# Patient Record
Sex: Male | Born: 1945 | Race: Black or African American | Hispanic: No | Marital: Single | Smoking: Never smoker
Health system: Southern US, Community
[De-identification: ages and names within clinical notes are randomized; demographics above are authoritative.]

## PROBLEM LIST (undated history)

## (undated) DIAGNOSIS — T7840XA Allergy, unspecified, initial encounter: Secondary | ICD-10-CM

## (undated) DIAGNOSIS — M1990 Unspecified osteoarthritis, unspecified site: Secondary | ICD-10-CM

## (undated) HISTORY — PX: TONSILLECTOMY: SUR1361

## (undated) HISTORY — DX: Unspecified osteoarthritis, unspecified site: M19.90

## (undated) HISTORY — DX: Allergy, unspecified, initial encounter: T78.40XA

## (undated) HISTORY — PX: APPENDECTOMY: SHX54

---

## 1998-08-14 ENCOUNTER — Encounter: Payer: Self-pay | Admitting: Thoracic Surgery

## 1998-08-14 ENCOUNTER — Ambulatory Visit (HOSPITAL_COMMUNITY): Admission: RE | Admit: 1998-08-14 | Discharge: 1998-08-14 | Payer: Self-pay | Admitting: Thoracic Surgery

## 2005-03-28 ENCOUNTER — Emergency Department (HOSPITAL_COMMUNITY): Admission: EM | Admit: 2005-03-28 | Discharge: 2005-03-29 | Payer: Self-pay | Admitting: Emergency Medicine

## 2007-03-15 ENCOUNTER — Inpatient Hospital Stay (HOSPITAL_COMMUNITY): Admission: EM | Admit: 2007-03-15 | Discharge: 2007-03-30 | Payer: Self-pay | Admitting: Emergency Medicine

## 2007-03-16 ENCOUNTER — Ambulatory Visit: Payer: Self-pay | Admitting: Infectious Diseases

## 2007-03-19 ENCOUNTER — Encounter (INDEPENDENT_AMBULATORY_CARE_PROVIDER_SITE_OTHER): Payer: Self-pay | Admitting: *Deleted

## 2007-03-19 ENCOUNTER — Ambulatory Visit: Payer: Self-pay | Admitting: Vascular Surgery

## 2010-01-13 ENCOUNTER — Encounter (INDEPENDENT_AMBULATORY_CARE_PROVIDER_SITE_OTHER): Payer: Self-pay | Admitting: Emergency Medicine

## 2010-01-13 ENCOUNTER — Emergency Department (HOSPITAL_COMMUNITY)
Admission: EM | Admit: 2010-01-13 | Discharge: 2010-01-13 | Disposition: A | Discharge status: Other Acute Inpatient Hospital | Payer: Self-pay | Source: Home / Self Care | Admitting: Family Medicine

## 2010-01-13 ENCOUNTER — Inpatient Hospital Stay (HOSPITAL_COMMUNITY)
Admission: EM | Admit: 2010-01-13 | Discharge: 2010-01-15 | Payer: Self-pay | Source: Home / Self Care | Attending: Internal Medicine | Admitting: Internal Medicine

## 2010-02-07 NOTE — Discharge Summary (Addendum)
  NAME:  William Hutchinson, William Hutchinson            ACCOUNT NO.:  [id number]  MEDICAL RECORD NO.:  [id number]          PATIENT TYPE:  INP  LOCATION:  5522                         FACILITY:  [facility]  PHYSICIAN:  Rock Nephew, MD       DATE OF BIRTH:  [birth year]  DATE OF ADMISSION:  01/13/2010 DATE OF DISCHARGE:  01/15/2010                        DISCHARGE SUMMARY - REFERRING   PRIMARY CARE PHYSICIAN:  Dr. Theodoro Grist.  Office phone number [phone].  DISCHARGE MEDICATIONS:  The patient's discharge diagnosis left lower extremity cellulitis, improved protein-calorie malnutrition, pre diabetes and obesity.  DISCHARGE MEDICATIONS:  The patient are: Clindamycin 300 mg by mouth  daily.  DIET:  The patient's diet should be  low carbohydrate, low-fat, low- calorie.  FOLLOW-UP:  The patient's follow-up with Dr. Theodoro Grist within 1 week.  PROCEDURE PERFORMED:  The patient had no procedures performed.  CONSULTATIONS:  None.  CHIEF COMPLAINT:  Left lower extremity cellulitis.  HISTORY OF PRESENT ILLNESS:  The patient is a  65 year old male with no past medical history, on no chronic medications presents to emergency room with left leg swelling, increased redness and painful for the last 4-5 days.  He denies any fevers or chills, any systemic illness.  The patient had ultrasound in the emergency room with a left leg which no evidence of DVT.  The patient had a normal cell white with WBC count.  HOSPITAL COURSE: 1. Left lower extremity cellulitis.  The patient was placed on     vancomycin and Zosyn.  During the hospitalization the patient had     blood cultures drawn which were negative 2 out of 2.  The patient     will be discharged home on an additional 8 days of clindamycin.     The patient received 3 days of vancomycin, Zosyn during the     hospitalization.  Patient follow up with Dr. Theodoro Grist within 1     week. 2. Protein-calorie malnutrition. The patient's  albumin came back at     2.5.  This  could be related to the cellulitis     for the patient, however the patient's albumin should be monitored     in the future. 3. Pre diabetes.  The patient had a hemoglobin A1c which came back at     5.9 with mean plasma glucose 123.  The patient was counseled to     lose weight and avoid heavy carbohydrate meals.     Rock Nephew, MD     NH/MEDQ  D:  01/15/2010  T:  01/15/2010  Job:  [id number]  Electronically Signed by Rock Nephew MD on 02/07/2010 06:13:09 PM

## 2010-03-26 LAB — DIFFERENTIAL
Basophils Absolute: 0 10*3/uL (ref 0.0–0.1)
Basophils Absolute: 0 10*3/uL (ref 0.0–0.1)
Basophils Relative: 0 % (ref 0–1)
Basophils Relative: 0 % (ref 0–1)
Eosinophils Absolute: 0.1 10*3/uL (ref 0.0–0.7)
Eosinophils Absolute: 0.4 10*3/uL (ref 0.0–0.7)
Eosinophils Relative: 1 % (ref 0–5)
Eosinophils Relative: 4 % (ref 0–5)
Lymphocytes Relative: 19 % (ref 12–46)
Lymphocytes Relative: 19 % (ref 12–46)
Lymphs Abs: 1.6 10*3/uL (ref 0.7–4.0)
Lymphs Abs: 1.7 10*3/uL (ref 0.7–4.0)
Monocytes Absolute: 0.9 10*3/uL (ref 0.1–1.0)
Monocytes Absolute: 1.1 10*3/uL — ABNORMAL HIGH (ref 0.1–1.0)
Monocytes Relative: 10 % (ref 3–12)
Monocytes Relative: 12 % (ref 3–12)
Neutro Abs: 5.6 10*3/uL (ref 1.7–7.7)
Neutro Abs: 5.7 10*3/uL (ref 1.7–7.7)
Neutrophils Relative %: 66 % (ref 43–77)
Neutrophils Relative %: 67 % (ref 43–77)

## 2010-03-26 LAB — COMPREHENSIVE METABOLIC PANEL
ALT: 18 U/L (ref 0–53)
AST: 21 U/L (ref 0–37)
Albumin: 2.5 g/dL — ABNORMAL LOW (ref 3.5–5.2)
Alkaline Phosphatase: 59 U/L (ref 39–117)
BUN: 7 mg/dL (ref 6–23)
CO2: 26 mEq/L (ref 19–32)
Calcium: 8.3 mg/dL — ABNORMAL LOW (ref 8.4–10.5)
Chloride: 107 mEq/L (ref 96–112)
Creatinine, Ser: 0.97 mg/dL (ref 0.4–1.5)
GFR calc Af Amer: >60 mL/min (ref >60)
GFR calc non Af Amer: >60 mL/min (ref >60)
Glucose, Bld: 105 mg/dL — ABNORMAL HIGH (ref 70–99)
Potassium: 3.9 mEq/L (ref 3.5–5.1)
Sodium: 138 mEq/L (ref 135–145)
Total Bilirubin: 0.8 mg/dL (ref 0.3–1.2)
Total Protein: 6.4 g/dL (ref 6.0–8.3)

## 2010-03-26 LAB — POCT I-STAT, CHEM 8
BUN: 7 mg/dL (ref 6–23)
Calcium, Ion: 1.06 mmol/L — ABNORMAL LOW (ref 1.12–1.32)
Chloride: 104 mEq/L (ref 96–112)
Creatinine, Ser: 0.7 mg/dL (ref 0.4–1.5)
Glucose, Bld: 106 mg/dL — ABNORMAL HIGH (ref 70–99)
HCT: 45 % (ref 39.0–52.0)
Hemoglobin: 15.3 g/dL (ref 13.0–17.0)
Potassium: 3.8 mEq/L (ref 3.5–5.1)
Sodium: 138 mEq/L (ref 135–145)
TCO2: 26 mmol/L (ref 0–100)

## 2010-03-26 LAB — HEMOGLOBIN A1C
Hgb A1c MFr Bld: 5.9 % — ABNORMAL HIGH (ref <5.7)
Mean Plasma Glucose: 123 mg/dL — ABNORMAL HIGH (ref <117)

## 2010-03-26 LAB — CULTURE, BLOOD (ROUTINE X 2)
Culture  Setup Time: 201201010033
Culture  Setup Time: 201201010033
Culture: NO GROWTH
Culture: NO GROWTH

## 2010-03-26 LAB — CBC
HCT: 39.5 % (ref 39.0–52.0)
HCT: 42.3 % (ref 39.0–52.0)
Hemoglobin: 13 g/dL (ref 13.0–17.0)
Hemoglobin: 13.7 g/dL (ref 13.0–17.0)
MCH: 29.2 pg (ref 26.0–34.0)
MCH: 29.7 pg (ref 26.0–34.0)
MCHC: 32.4 g/dL (ref 30.0–36.0)
MCHC: 32.9 g/dL (ref 30.0–36.0)
MCV: 90.2 fL (ref 78.0–100.0)
MCV: 90.4 fL (ref 78.0–100.0)
Platelets: 228 10*3/uL (ref 150–400)
Platelets: 232 10*3/uL (ref 150–400)
RBC: 4.37 MIL/uL (ref 4.22–5.81)
RBC: 4.69 MIL/uL (ref 4.22–5.81)
RDW: 13.5 % (ref 11.5–15.5)
RDW: 13.6 % (ref 11.5–15.5)
WBC: 8.4 10*3/uL (ref 4.0–10.5)
WBC: 8.5 10*3/uL (ref 4.0–10.5)

## 2010-03-26 LAB — MAGNESIUM: Magnesium: 1.9 mg/dL (ref 1.5–2.5)

## 2010-03-26 LAB — PHOSPHORUS: Phosphorus: 3.3 mg/dL (ref 2.3–4.6)

## 2010-03-26 LAB — PROTIME-INR
INR: 0.99 (ref 0.00–1.49)
Prothrombin Time: 13.3 seconds (ref 11.6–15.2)

## 2010-04-27 ENCOUNTER — Other Ambulatory Visit: Payer: Self-pay | Admitting: Family Medicine

## 2010-04-27 DIAGNOSIS — M25562 Pain in left knee: Secondary | ICD-10-CM

## 2010-04-30 ENCOUNTER — Ambulatory Visit
Admission: RE | Admit: 2010-04-30 | Discharge: 2010-04-30 | Disposition: A | Discharge status: Home / Self Care | Payer: 59 | Source: Ambulatory Visit | Attending: Family Medicine | Admitting: Family Medicine

## 2010-04-30 DIAGNOSIS — M25562 Pain in left knee: Secondary | ICD-10-CM

## 2010-05-29 NOTE — Discharge Summary (Signed)
NAME:  William Hutchinson, William Hutchinson            ACCOUNT NO.:  [id number]   MEDICAL RECORD NO.:  [id number]          PATIENT TYPE:  INP   LOCATION:  5030                         FACILITY:  [facility]   PHYSICIAN:  Hettie Holstein, D.O.    DATE OF BIRTH:  [birth year]   DATE OF ADMISSION:  03/15/2007  DATE OF DISCHARGE:                               DISCHARGE SUMMARY   PRIMARY CARE PHYSICIAN:  Dr. Theodoro Grist.  Phone number to his office is  [phone].   INFECTIOUS DISEASE CONSULTANT:  Dr. Johny Sax.   DATE OF DISCHARGE:  Anticipated for March 25, 2007.   FINAL DIAGNOSIS:  1. Cellulitis of right lower extremity with very slow resolution after      course on broad-spectrum antibiotics that included vancomycin,      Zosyn, clindamycin and Primaxin.  He will conclude his treatment      course of Primaxin today and vancomycin tomorrow to conclude 10      days of therapy.  2. Obesity.  3. Fever and leukocytosis attributable to the above.   DISCHARGE MEDICATIONS:  He will be discharged on  1. One week of doxycycline 100 mg p.o. b.i.d.  2. A prescription for Percocet as well will be provided at 5/325 one-      two tablets every six hours as needed for pain.   DISPOSITION:  At present William Hutchinson is felt to be improved over his  hospital course and stable for discharge once he concludes his course  with IV antibiotics.  I have asked that he follow up with Dr. Theodoro Grist within one week to assure improvement.   HISTORY OF PRESENT ILLNESS:  William Hutchinson is a pleasant 65 year old  male who presented to the emergency department with worsening redness  and swelling of his right foot and right lower leg.  He stated that  these began two weeks prior to presentation when he was in [place]  on a trip and had blistering and weeping of the skin around his ankle.  He used herbal remedy in [place], however, the symptoms became  worse and he reported having an allergic reaction to the remedy.  He  reported the with the medication as Barbatmao. He denied have any known  insect bites.  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   HOSPITAL COURSE:  He was admitted, started carefully on broad-spectrum  IV antibiotics with vancomycin and Zosyn.  His course was refractory.  He underwent a infectious disease consultation. A dedicated CT scan of  his foot revealed no evidence of soft tissue abscess involving the right  lower leg.  There was some diffuse subcutaneous edema and numerous  varicose veins and severe osteoarthritis involving the knee.  He was  placed on DVT prophylactic measures.  He was seen in consultation by  infectious disease, Dr. Ninetta Lights.  Ultimately he was transitioned to  Primaxin and vancomycin.  PICC line was placed.  He continued to elevate  his lower extremity, continued have some fevers spiking quite high as  well as leukocytosis.  He  eventually began to defervesce and he  underwent vascular lab evaluation for DVT.  There was no evidence of  DVT.  This was performed on March 19, 2007, for follow-up due to  refractory nature of his infection.  He underwent a second CT scan which  revealed minimal changes.  He had some blistering lesions that were  cultured and have remained sterile to date.  His blood cultures as well  have remained sterile to date.   Most recent laboratory data include a white count of 8700, hemoglobin  11.6 and a platelet count of 482.  Most recent basic metabolic panel  revealed a BUN of 137, potassium 3.6, BUN 4, creatinine 0.75, glucose of  101.      Hettie Holstein, D.O.  Electronically Signed     ESS/MEDQ  D:  03/24/2007  T:  03/25/2007  Job:  [id number]   cc:   Theodoro Grist, M.D.

## 2010-05-29 NOTE — H&P (Signed)
NAME:  William Hutchinson, William Hutchinson            ACCOUNT NO.:  [id number]   MEDICAL RECORD NO.:  [id number]          PATIENT TYPE:  INP   LOCATION:  5030                         FACILITY:  [facility]   PHYSICIAN:  Della Goo, M.D. DATE OF BIRTH:  04-09-1945   DATE OF ADMISSION:  03/15/2007  DATE OF DISCHARGE:                              HISTORY & PHYSICAL   PRIMARY CARE PHYSICIAN:  Unassigned, Dr. Theodoro Grist.   CHIEF COMPLAINT:  Redness and swelling of the right lower leg and foot.   HISTORY OF PRESENT ILLNESS:  This is a 64 year old male who presents to  the emergency department secondary to complaints of worsening redness,  swelling of the right foot and right lower leg.  The patient reports his  symptoms began two weeks ago when he was in [place] on a trip and  he had blistering and weeping of the skin around the ankle.  He reports  using an herbal remedy in [place], however, the symptoms became  worse and he reports having an allergic reaction to this.  The name of  the medication/herb was more Barbatmao.  The patient denies having any  known insect bites.  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  He denies having any fevers, chills,  shortness of breath, chest pain, nausea, vomiting, diarrhea.   PAST MEDICAL HISTORY:  None.   PAST SURGICAL HISTORY:  None.   MEDICATIONS:  None.   ALLERGIES:  No known allergies except this reaction to the [facility].   SOCIAL HISTORY:  Nonsmoker, nondrinker.   FAMILY HISTORY:  Negative.   PHYSICAL EXAMINATION:  GENERAL APPEARANCE:  This is a morbidly obese 27-  year-old male who is in discomfort but no acute distress.  VITAL SIGNS:  He does have a fever at this time, temperature 100.6.  Blood pressure 140/77, heart rate 115, respirations 18, O2 saturations  99% on room air.  HEENT:  Normocephalic, atraumatic.  Pupils equally round and reactive to  light.  There is no scleral icterus.  Funduscopic  benign.  Oropharynx is  clear.  NECK:  Supple, full range of motion.  No thyromegaly, adenopathy,  jugular venous distention.  CARDIOVASCULAR:  Tachycardiac rate and rhythm.  No murmurs, gallops or  rubs.  LUNGS:  Clear to auscultation bilaterally.  ABDOMEN:  Positive bowel sounds, soft, nontender, nondistended.  EXTREMITIES:  Left lower extremity without cyanosis, clubbing or edema.  Right lower extremity with 3+ edema to the prepatellar areas.  This is a  tense edema which is tender and there is erythema which is confluent in  this area.  There is no visible ulceration of the foot or toes.  NEUROLOGIC:  Nonfocal.   LABORATORY STUDIES:  White blood cell count 11.9, hemoglobin 14.4,  hematocrit 43.2, MCV 90.1, platelets 245, neutrophils 84%, lymphocytes  6%.  Chemistry with a sodium of 134, potassium 3.7, chloride 100, bicarb  27, BUN 11, creatinine 1.1 and glucose 109.  Protime 13, INR 1 and PTT  26.   ASSESSMENT:  A 65 year old male being admitted with  1. Cellulitis of  the right lower extremity.  2. Febrile illness secondary to #1.  3. Early sepsis secondary #1.  4. Morbid obesity.   PLAN:  The patient is be admitted and placed on IV antibiotic therapy of  vancomycin and Zosyn.  IV fluids have been ordered for fluid  resuscitation.  Imaging studies will be ordered of the right lower  extremity and foot to evaluate for possible osteomyelitis.  Pain control  therapy has also been ordered as needed.  Further workup will ensue  pending the patient's response to antibiotic therapy.  If condition  worsens, consultation of infectious diseases will be requested.  DVT and  GI prophylaxis have also been ordered.      Della Goo, M.D.  Electronically Signed     HJ/MEDQ  D:  03/16/2007  T:  03/17/2007  Job:  [id number]

## 2010-05-29 NOTE — Discharge Summary (Signed)
NAME:  William Hutchinson, William Hutchinson            ACCOUNT NO.:  [id number]   MEDICAL RECORD NO.:  [id number]          PATIENT TYPE:  INP   LOCATION:  5030                         FACILITY:  [facility]   PHYSICIAN:  Eduard Clos, MDDATE OF BIRTH:  05-18-1945   DATE OF ADMISSION:  03/15/2007  DATE OF DISCHARGE:                               DISCHARGE SUMMARY   ADDENDUM:  Please refer to the discharge summary dictated by Hettie Holstein, D.O., on March 24, 2007, for the early course in the hospital.  The patient's discharge was withheld because the patient started having  fevers again of 101-102.  His antibiotics were restarted intravenously.  Repeat blood cultures were obtained again, which were showing no growth  so far, and the original blood cultures also which were done at  admission did not show any growth, and wound cultures also did not show  growth.  Per infectious disease, the patient again had a CT scan of the  lower extremities which actually showed improved edema and cellulitis of  the lower calf and ankle region, no gas in the soft tissues or abscess  identified, no osteomyelitis.  With the restarting of IV antibiotics,  the patient's fever started resolving.  At this time the patient is  stable and is able to ambulate, fever has improved, and his leukocyte  elevation present at admission has resolved.  As per infectious disease,  the patient can be discharged on doxycycline for 14 more days.  At time  of discharge the patient is hemodynamically stable.   ASSESSMENT:  1. Cellulitis of the right lower extremity.  2. Fever and leukocytosis secondary to the above, which have resolved.   MEDICATIONS AT DISCHARGE:  1. Doxycycline 100 mg p.o. twice daily for 14 days.  2. Percocet 5/325 mg one to two tablets p.o. q.6 h. p.r.n. for pain, a      total of 30 tablets have been prescribed.   PLAN:  Patient to follow with his primary care physician in 3 days.  In  the event of any worsening of  symptoms, patient advised to go to the  nearest ER.  Any pending labs to follow with his primary care physician.      Eduard Clos, MD  Electronically Signed     ANK/MEDQ  D:  03/30/2007  T:  03/30/2007  Job:  [id number]

## 2010-06-22 ENCOUNTER — Inpatient Hospital Stay (INDEPENDENT_AMBULATORY_CARE_PROVIDER_SITE_OTHER)
Admission: RE | Admit: 2010-06-22 | Discharge: 2010-06-22 | Disposition: A | Discharge status: Home / Self Care | Payer: 59 | Source: Ambulatory Visit | Attending: Emergency Medicine | Admitting: Emergency Medicine

## 2010-06-22 DIAGNOSIS — R319 Hematuria, unspecified: Secondary | ICD-10-CM

## 2010-06-22 DIAGNOSIS — N12 Tubulo-interstitial nephritis, not specified as acute or chronic: Secondary | ICD-10-CM

## 2010-06-22 LAB — POCT URINALYSIS DIP (DEVICE)
Nitrite: POSITIVE — AB
Protein, ur: >=300 mg/dL — AB
Urobilinogen, UA: 2 mg/dL — ABNORMAL HIGH (ref 0.0–1.0)

## 2010-06-22 LAB — POCT I-STAT, CHEM 8
Hemoglobin: 16.7 g/dL (ref 13.0–17.0)
Sodium: 136 mEq/L (ref 135–145)
TCO2: 27 mmol/L (ref 0–100)

## 2010-06-23 LAB — URINE CULTURE

## 2010-06-24 ENCOUNTER — Inpatient Hospital Stay (INDEPENDENT_AMBULATORY_CARE_PROVIDER_SITE_OTHER)
Admission: RE | Admit: 2010-06-24 | Discharge: 2010-06-24 | Disposition: A | Discharge status: Home / Self Care | Payer: 59 | Source: Ambulatory Visit | Attending: Family Medicine | Admitting: Family Medicine

## 2010-06-24 DIAGNOSIS — N390 Urinary tract infection, site not specified: Secondary | ICD-10-CM

## 2010-06-24 DIAGNOSIS — R319 Hematuria, unspecified: Secondary | ICD-10-CM

## 2010-06-24 LAB — POCT URINALYSIS DIP (DEVICE)
Protein, ur: 100 mg/dL — AB
Specific Gravity, Urine: >=1.03 (ref 1.005–1.030)
pH: 5.5 (ref 5.0–8.0)

## 2010-06-28 ENCOUNTER — Ambulatory Visit
Admission: RE | Admit: 2010-06-28 | Discharge: 2010-06-28 | Disposition: A | Discharge status: Home / Self Care | Payer: 59 | Source: Ambulatory Visit | Attending: Internal Medicine | Admitting: Internal Medicine

## 2010-06-28 ENCOUNTER — Other Ambulatory Visit: Payer: Self-pay | Admitting: Internal Medicine

## 2010-06-28 DIAGNOSIS — N390 Urinary tract infection, site not specified: Secondary | ICD-10-CM

## 2010-06-28 DIAGNOSIS — R509 Fever, unspecified: Secondary | ICD-10-CM

## 2010-10-08 LAB — BASIC METABOLIC PANEL
BUN: 10
BUN: 4 — ABNORMAL LOW
BUN: 8
CO2: 24
CO2: 26
CO2: 26
Calcium: 7.8 — ABNORMAL LOW
Calcium: 8 — ABNORMAL LOW
Calcium: 8.1 — ABNORMAL LOW
Calcium: 8.9
Chloride: 102
Chloride: 103
Creatinine, Ser: 0.75
Creatinine, Ser: 0.93
Creatinine, Ser: 0.98
GFR calc Af Amer: >60
GFR calc Af Amer: >60
GFR calc Af Amer: >60
GFR calc non Af Amer: >60
GFR calc non Af Amer: >60
GFR calc non Af Amer: >60
Glucose, Bld: 105 — ABNORMAL HIGH
Glucose, Bld: 110 — ABNORMAL HIGH
Glucose, Bld: 115 — ABNORMAL HIGH
Potassium: 3.3 — ABNORMAL LOW
Potassium: 3.6
Potassium: 3.6
Potassium: 3.8
Sodium: 134 — ABNORMAL LOW
Sodium: 134 — ABNORMAL LOW
Sodium: 136

## 2010-10-08 LAB — WOUND CULTURE
Culture: NO GROWTH
Gram Stain: NONE SEEN

## 2010-10-08 LAB — CULTURE, BLOOD (ROUTINE X 2)
Culture: NO GROWTH
Culture: NO GROWTH

## 2010-10-08 LAB — CBC
HCT: 32.3 — ABNORMAL LOW
HCT: 34.5 — ABNORMAL LOW
HCT: 38 — ABNORMAL LOW
Hemoglobin: 10.9 — ABNORMAL LOW
Hemoglobin: 11 — ABNORMAL LOW
Hemoglobin: 11.6 — ABNORMAL LOW
Hemoglobin: 11.7 — ABNORMAL LOW
Hemoglobin: 12.6 — ABNORMAL LOW
Hemoglobin: 14.4
MCHC: 33.3
MCHC: 33.6
MCHC: 33.6
MCV: 90.1
MCV: 90.4
MCV: 90.5
MCV: 91.5
Platelets: 368
Platelets: 512 — ABNORMAL HIGH
RBC: 3.53 — ABNORMAL LOW
RBC: 3.59 — ABNORMAL LOW
RBC: 3.84 — ABNORMAL LOW
RBC: 3.93 — ABNORMAL LOW
RBC: 4.79
RDW: 13.7
RDW: 13.9
RDW: 14
WBC: 10.8 — ABNORMAL HIGH
WBC: 11.9 — ABNORMAL HIGH
WBC: 12.9 — ABNORMAL HIGH
WBC: 8.7

## 2010-10-08 LAB — I-STAT 8, (EC8 V) (CONVERTED LAB)
Acid-Base Excess: 2
BUN: 11
Bicarbonate: 26.6 — ABNORMAL HIGH
Chloride: 100
Glucose, Bld: 109 — ABNORMAL HIGH
HCT: 49
Hemoglobin: 16.7
Operator id: 277751
Potassium: 3.7
Sodium: 134 — ABNORMAL LOW
TCO2: 28
pCO2, Ven: 41.3 — ABNORMAL LOW
pH, Ven: 7.417 — ABNORMAL HIGH

## 2010-10-08 LAB — BASIC METABOLIC PANEL WITH GFR
Chloride: 98
Creatinine, Ser: 0.87
GFR calc Af Amer: >60
Sodium: 133 — ABNORMAL LOW

## 2010-10-08 LAB — DIFFERENTIAL
Basophils Relative: 0
Basophils Relative: 1
Eosinophils Absolute: 0
Eosinophils Absolute: 0
Lymphs Abs: 0.7
Lymphs Abs: 0.8
Monocytes Absolute: 0.6
Monocytes Absolute: 1
Monocytes Relative: 10
Monocytes Relative: 5
Monocytes Relative: 9
Neutro Abs: 11.2 — ABNORMAL HIGH
Neutrophils Relative %: 83 — ABNORMAL HIGH

## 2010-10-08 LAB — CRYPTOCOCCAL ANTIGEN

## 2010-10-08 LAB — APTT: aPTT: 26

## 2010-10-08 LAB — PROTIME-INR
INR: 1
Prothrombin Time: 13

## 2010-10-08 LAB — POCT I-STAT CREATININE
Creatinine, Ser: 1.1
Operator id: 277751

## 2012-05-28 ENCOUNTER — Ambulatory Visit: Payer: 59

## 2012-05-28 ENCOUNTER — Ambulatory Visit (INDEPENDENT_AMBULATORY_CARE_PROVIDER_SITE_OTHER): Payer: 59 | Admitting: Family Medicine

## 2012-05-28 VITALS — BP 151/83 | HR 78 | Temp 98.7°F | Resp 18 | Ht 74.5 in | Wt 340.0 lb

## 2012-05-28 DIAGNOSIS — M25561 Pain in right knee: Secondary | ICD-10-CM

## 2012-05-28 DIAGNOSIS — R29898 Other symptoms and signs involving the musculoskeletal system: Secondary | ICD-10-CM

## 2012-05-28 DIAGNOSIS — M25569 Pain in unspecified knee: Secondary | ICD-10-CM

## 2012-05-28 DIAGNOSIS — M1990 Unspecified osteoarthritis, unspecified site: Secondary | ICD-10-CM

## 2012-05-28 MED ORDER — MELOXICAM 7.5 MG PO TABS: 7.5000 mg | ORAL_TABLET | Freq: Every day | ORAL | Status: DC | PRN

## 2012-05-28 MED ORDER — CYCLOBENZAPRINE HCL 10 MG PO TABS: 10.0000 mg | ORAL_TABLET | Freq: Two times a day (BID) | ORAL | Status: DC | PRN

## 2012-05-28 MED ORDER — OXYCODONE-ACETAMINOPHEN 5-325 MG PO TABS: 1.0000 | ORAL_TABLET | Freq: Four times a day (QID) | ORAL | Status: DC | PRN

## 2012-05-28 NOTE — Progress Notes (Signed)
[facility]:  Office Visit  Chief Complaint:  Chief Complaint  Patient presents with  . Knee Pain    right knee pain unable to apply pressure since last night    HPI: William Hutchinson is a 67 y.o. male who complains of  Right knee pain , worsening sxs x 1 day, has had pain that has been progressive over several years in that knee  since he has had a left meniscus injury . He has compensating on his right leg. He bought crutches but they are too heavy. Sharp pain, can't weightbear at all. 7/10 pain mostly on left anterior side of knee. No h/o diabetes. Prior left meniscus tear, nonsurgical conservative management with PT. Has been using aleve and crutches without relief. Denies weakness, numbness, tingling, popping, locking, sensation on instability. 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  Past Medical History  Diagnosis Date  . Arthritis    Past Surgical History  Procedure Laterality Date  . Appendectomy     History   Social History  . Marital Status: Single    Spouse Name: N/A    Number of Children: N/A  . Years of Education: N/A   Social History Main Topics  . Smoking status: Never Smoker   . Smokeless tobacco: None  . Alcohol Use: Yes  . Drug Use: No  . Sexually Active: Yes    Birth Control/ Protection: Condom   Other Topics Concern  . None   Social History Narrative  . None   Family History  Problem Relation Age of Onset  . Arthritis Mother   . Dementia Mother    No Known Allergies Prior to Admission medications   Not on File     ROS: The patient denies fevers, chills, night sweats, unintentional weight loss, chest pain, palpitations, wheezing, dyspnea on exertion, nausea, vomiting, abdominal pain, dysuria, hematuria,  melena, numbness, weakness, or tingling.   All other systems have been reviewed and were otherwise negative with the exception of those mentioned in the HPI and as above.    PHYSICAL EXAM: Filed Vitals:   05/28/12 1824  BP: 151/83  Pulse: 78  Temp: 98.7 F (37.1 C)  Resp: 18   Filed Vitals:   05/28/12 1824  Height: 6' 2.5" (1.892 m)  Weight: 340 lb (154.223 kg)   Body mass index is 43.08 kg/(m^2).  General: Alert, no acute distress, tall , thick AA male HEENT:  Normocephalic, atraumatic, oropharynx patent.  Cardiovascular:  Regular rate and rhythm, no rubs murmurs or gallops.  No Carotid bruits, radial pulse intact. No pedal edema.  Respiratory: Clear to auscultation bilaterally.  No wheezes, rales, or rhonchi.  No cyanosis, no use of accessory musculature GI: No organomegaly, abdomen is soft and non-tender, positive bowel sounds.  No masses. Skin: No rashes. Neurologic: Facial musculature symmetric. Psychiatric: Patient is appropriate throughout our interaction. Lymphatic: No cervical lymphadenopathy Musculoskeletal: Gait limping right side Normal L-spine Normal hip exam Right knee-no deformities, no crepitus + right jt line tenderness, minimal effusion, slight warmth, no erythema 5/5 strength, 2/2 DTRs, neg Lachman, stable to valgus and varus stress   LABS: Results for orders placed during the hospital encounter of 06/24/10  POCT URINALYSIS DIP (DEVICE)      Result Value Range  Glucose, UA NEGATIVE  NEGATIVE mg/dL   Bilirubin Urine SMALL (*) NEGATIVE   Ketones, ur TRACE (*) NEGATIVE mg/dL   Specific Gravity, Urine >=1.030  1.005 - 1.030   Hgb urine dipstick LARGE (*) NEGATIVE   pH 5.5  5.0 - 8.0   Protein, ur 100 (*) NEGATIVE mg/dL   Urobilinogen, UA 2.0 (*) 0.0 - 1.0 mg/dL   Nitrite NEGATIVE  NEGATIVE   Leukocytes, UA NEGATIVE  NEGATIVE     EKG/XRAY:   Primary read interpreted by Dr. Conley Rolls at [facility]. + DJD  No fx/dislocation  ASSESSMENT/PLAN: Encounter  Diagnoses  Name Primary?  . Knee pain, right Yes  . Arthritis    Hinge knee brace and also crutches given to patient (he requested lighter crutches) Rx Flexeril Rx Percocet Rx Mobic F/u prn Declined steroid injection for now Will go to [facility] ortho for PT Gross sideeffects, risk and benefits, and alternatives of medications d/w patient. Patient is aware that all medications have potential sideeffects and we are unable to predict every sideeffect or drug-drug interaction that may occur. F/u prn    Reagan Klemz PHUONG, DO 05/28/2012 7:25 PM

## 2012-08-15 ENCOUNTER — Ambulatory Visit (INDEPENDENT_AMBULATORY_CARE_PROVIDER_SITE_OTHER): Payer: Medicare Other | Admitting: Family Medicine

## 2012-08-15 VITALS — BP 140/80 | HR 91 | Temp 98.0°F | Resp 16 | Ht 73.0 in | Wt 335.0 lb

## 2012-08-15 DIAGNOSIS — G589 Mononeuropathy, unspecified: Secondary | ICD-10-CM

## 2012-08-15 DIAGNOSIS — R7309 Other abnormal glucose: Secondary | ICD-10-CM

## 2012-08-15 DIAGNOSIS — I872 Venous insufficiency (chronic) (peripheral): Secondary | ICD-10-CM

## 2012-08-15 DIAGNOSIS — M79609 Pain in unspecified limb: Secondary | ICD-10-CM

## 2012-08-15 DIAGNOSIS — R739 Hyperglycemia, unspecified: Secondary | ICD-10-CM

## 2012-08-15 DIAGNOSIS — M79671 Pain in right foot: Secondary | ICD-10-CM

## 2012-08-15 DIAGNOSIS — I8310 Varicose veins of unspecified lower extremity with inflammation: Secondary | ICD-10-CM

## 2012-08-15 DIAGNOSIS — G629 Polyneuropathy, unspecified: Secondary | ICD-10-CM

## 2012-08-15 LAB — POCT GLYCOSYLATED HEMOGLOBIN (HGB A1C): Hemoglobin A1C: 5.8

## 2012-08-15 MED ORDER — SILVER SULFADIAZINE 1 % EX CREA: TOPICAL_CREAM | Freq: Two times a day (BID) | CUTANEOUS | Status: DC

## 2012-08-15 NOTE — Progress Notes (Signed)
[facility]:  Office Visit  Chief Complaint:  Chief Complaint  Patient presents with  . open sore    feet x years    HPI: William Hutchinson is a 67 y.o. male who complains of chronic left foot skin changes and ulcers on feets bialterally more on right then left, located below the inner ankles. He has had sclerotherapy. Denies DM. The skin would become thickened and then become ulcerated, he was told he had eczema at one paoitn and was given steroid cream and that helped some but did not resolve the issue. He has had skin changes to it. He has leg swelling , he denies having pain when walking, he denies PAD, he does have vein problems where thay have intervened with some type of vein surgery, ? Sclerotherapy. . It is painful at times. Only in that spot. Burning pain.   Past Medical History  Diagnosis Date  . Arthritis    Past Surgical History  Procedure Laterality Date  . Appendectomy     History   Social History  . Marital Status: Single    Spouse Name: N/A    Number of Children: N/A  . Years of Education: N/A   Social History Main Topics  . Smoking status: Never Smoker   . Smokeless tobacco: None  . Alcohol Use: Yes  . Drug Use: No  . Sexually Active: Yes    Birth Control/ Protection: Condom   Other Topics Concern  . None   Social History Narrative  . None   Family History  Problem Relation Age of Onset  . Arthritis Mother   . Dementia Mother    No Known Allergies Prior to Admission medications   Medication Sig Start Date End Date Taking? Authorizing Provider  cyclobenzaprine (FLEXERIL) 10 MG tablet Take 1 tablet (10 mg total) by mouth 2 (two) times daily as needed for muscle spasms. 05/28/12   Nela Bascom P Altamese Deguire, DO  meloxicam (MOBIC) 7.5 MG tablet Take 1 tablet (7.5 mg total) by mouth daily as needed for pain. Take with food. 05/28/12   Hiyab Nhem P Zakiah Gauthreaux, DO  oxyCODONE-acetaminophen (ROXICET) 5-325 MG per tablet Take 1 tablet by mouth every 6 (six) hours as  needed for pain. 05/28/12   Byan Poplaski P Adarrius Graeff, DO     ROS: The patient denies fevers, chills, night sweats, unintentional weight loss, chest pain, palpitations, wheezing, dyspnea on exertion, nausea, vomiting, abdominal pain, dysuria, hematuria, melena  All other systems have been reviewed and were otherwise negative with the exception of those mentioned in the HPI and as above.    PHYSICAL EXAM: Filed Vitals:   08/15/12 1148  BP: 140/80  Pulse: 91  Temp: 98 F (36.7 C)  Resp: 16   Filed Vitals:   08/15/12 1148  Height: 6\' 1"  (1.854 m)  Weight: 335 lb (151.955 kg)   Body mass index is 44.21 kg/(m^2).  General: Alert, no acute distress HEENT:  Normocephalic, atraumatic, oropharynx patent.  Cardiovascular:  Regular rate and rhythm, no rubs murmurs or gallops.  No Carotid bruits, radial pulse intact. + pedal edema. Non pitting Respiratory: Clear to auscultation bilaterally.  No wheezes, rales, or rhonchi.  No cyanosis, no use of accessory musculature GI: No organomegaly, abdomen is soft and non-tender, positive bowel sounds.  No masses. Skin: + venous stasis dermatitis with open ulcer on medial right inner foot near medial malleoli, vascular surgical  Scar on right side only, + DP, + posterior tib Neurologic: Facial musculature symmetric.  Psychiatric: Patient is appropriate throughout our interaction. Lymphatic: No cervical lymphadenopathy Musculoskeletal: Gait intact.   LABS: Results for orders placed in visit on 08/15/12  POCT GLYCOSYLATED HEMOGLOBIN (HGB A1C)      Result Value Range   Hemoglobin A1C 5.8       EKG/XRAY:   Primary read interpreted by Dr. Conley Rolls at [facility].   ASSESSMENT/PLAN: Encounter Diagnoses  Name Primary?  . Neuropathy Yes  . Foot pain, right   . Hyperglycemia    No diabetes based on HbA1c He has venous stasis dermatitis with ulcers near pressure points of wehre his shoe meets the posterior aspects of his medial malleoli bialterally Rx Silvidene and non  adhesive pads, wound care with soap and water BID Loose shoes and avoid shoes that causese more pressure points F/u in 2 weeks or prn    Kresta Templeman PHUONG, DO 08/15/2012 1:09 PM

## 2012-09-06 ENCOUNTER — Ambulatory Visit (INDEPENDENT_AMBULATORY_CARE_PROVIDER_SITE_OTHER): Payer: Medicare Other | Admitting: Emergency Medicine

## 2012-09-06 ENCOUNTER — Ambulatory Visit (HOSPITAL_COMMUNITY)
Admission: RE | Admit: 2012-09-06 | Discharge: 2012-09-06 | Disposition: A | Discharge status: Home / Self Care | Payer: Medicare Other | Source: Ambulatory Visit | Attending: Emergency Medicine | Admitting: Emergency Medicine

## 2012-09-06 ENCOUNTER — Emergency Department (HOSPITAL_COMMUNITY)
Admission: EM | Admit: 2012-09-06 | Discharge: 2012-09-06 | Disposition: A | Discharge status: Home / Self Care | Payer: Medicare Other | Attending: Emergency Medicine | Admitting: Emergency Medicine

## 2012-09-06 ENCOUNTER — Encounter (HOSPITAL_COMMUNITY): Payer: Self-pay | Admitting: Emergency Medicine

## 2012-09-06 ENCOUNTER — Ambulatory Visit (HOSPITAL_COMMUNITY)
Admission: RE | Admit: 2012-09-06 | Discharge: 2012-09-06 | Disposition: A | Discharge status: Home / Self Care | Payer: Self-pay | Source: Ambulatory Visit | Attending: Emergency Medicine | Admitting: Emergency Medicine

## 2012-09-06 ENCOUNTER — Emergency Department (HOSPITAL_COMMUNITY): Admission: EM | Admit: 2012-09-06 | Payer: 59 | Source: Home / Self Care

## 2012-09-06 VITALS — BP 143/86 | HR 77 | Temp 99.0°F | Resp 18 | Wt 333.0 lb

## 2012-09-06 DIAGNOSIS — S83207A Unspecified tear of unspecified meniscus, current injury, left knee, initial encounter: Secondary | ICD-10-CM

## 2012-09-06 DIAGNOSIS — K449 Diaphragmatic hernia without obstruction or gangrene: Secondary | ICD-10-CM | POA: Insufficient documentation

## 2012-09-06 DIAGNOSIS — M47817 Spondylosis without myelopathy or radiculopathy, lumbosacral region: Secondary | ICD-10-CM | POA: Insufficient documentation

## 2012-09-06 DIAGNOSIS — Z8739 Personal history of other diseases of the musculoskeletal system and connective tissue: Secondary | ICD-10-CM | POA: Insufficient documentation

## 2012-09-06 DIAGNOSIS — N200 Calculus of kidney: Secondary | ICD-10-CM | POA: Insufficient documentation

## 2012-09-06 DIAGNOSIS — K429 Umbilical hernia without obstruction or gangrene: Secondary | ICD-10-CM | POA: Insufficient documentation

## 2012-09-06 DIAGNOSIS — Q619 Cystic kidney disease, unspecified: Secondary | ICD-10-CM | POA: Insufficient documentation

## 2012-09-06 DIAGNOSIS — K420 Umbilical hernia with obstruction, without gangrene: Secondary | ICD-10-CM

## 2012-09-06 DIAGNOSIS — IMO0002 Reserved for concepts with insufficient information to code with codable children: Secondary | ICD-10-CM

## 2012-09-06 DIAGNOSIS — K469 Unspecified abdominal hernia without obstruction or gangrene: Secondary | ICD-10-CM

## 2012-09-06 DIAGNOSIS — E669 Obesity, unspecified: Secondary | ICD-10-CM | POA: Insufficient documentation

## 2012-09-06 LAB — CBC WITH DIFFERENTIAL/PLATELET
Eosinophils Relative: 2 % (ref 0–5)
HCT: 45.7 % (ref 39.0–52.0)
Lymphocytes Relative: 24 % (ref 12–46)
Lymphs Abs: 1.8 10*3/uL (ref 0.7–4.0)
MCV: 91.8 fL (ref 78.0–100.0)
Monocytes Absolute: 0.9 10*3/uL (ref 0.1–1.0)
Monocytes Relative: 12 % (ref 3–12)
RBC: 4.98 MIL/uL (ref 4.22–5.81)
WBC: 7.7 10*3/uL (ref 4.0–10.5)

## 2012-09-06 LAB — POCT CBC
HCT, POC: 46.7 % (ref 43.5–53.7)
Hemoglobin: 14.6 g/dL (ref 14.1–18.1)
Lymph, poc: 1.8 (ref 0.6–3.4)
MCH, POC: 30.4 pg (ref 27–31.2)
MCHC: 31.3 g/dL — AB (ref 31.8–35.4)
MPV: 8.4 fL (ref 0–99.8)
POC LYMPH PERCENT: 23.2 %L (ref 10–50)
POC MID %: 6.7 %M (ref 0–12)
RDW, POC: 14.3 %
WBC: 7.9 10*3/uL (ref 4.6–10.2)

## 2012-09-06 LAB — URINALYSIS, ROUTINE W REFLEX MICROSCOPIC
Bilirubin Urine: NEGATIVE
Nitrite: NEGATIVE
Specific Gravity, Urine: 1.01 (ref 1.005–1.030)
Urobilinogen, UA: 1 mg/dL (ref 0.0–1.0)

## 2012-09-06 LAB — COMPREHENSIVE METABOLIC PANEL
ALT: 29 U/L (ref 0–53)
Alkaline Phosphatase: 77 U/L (ref 39–117)
BUN: 16 mg/dL (ref 6–23)
CO2: 30 mEq/L (ref 19–32)
CO2: 26 mEq/L (ref 19–32)
Calcium: 9.1 mg/dL (ref 8.4–10.5)
Creat: 1.09 mg/dL (ref 0.50–1.35)
Creatinine, Ser: 0.92 mg/dL (ref 0.50–1.35)
GFR calc Af Amer: >90 mL/min (ref >90)
GFR calc non Af Amer: 87 mL/min — ABNORMAL LOW (ref >90)
Glucose, Bld: 110 mg/dL — ABNORMAL HIGH (ref 70–99)
Glucose, Bld: 91 mg/dL (ref 70–99)
Total Bilirubin: 0.9 mg/dL (ref 0.3–1.2)

## 2012-09-06 MED ORDER — HYDROCODONE-ACETAMINOPHEN 5-325 MG PO TABS: 1.0000 | ORAL_TABLET | ORAL | Status: DC | PRN

## 2012-09-06 MED ORDER — SODIUM CHLORIDE 0.9 % IV BOLUS (SEPSIS): 500.0000 mL | Freq: Once | INTRAVENOUS | Status: DC

## 2012-09-06 MED ORDER — IOHEXOL 300 MG/ML  SOLN
125.0000 mL | Freq: Once | INTRAMUSCULAR | Status: AC | PRN
  Administered 2012-09-06: 125 mL via INTRAVENOUS

## 2012-09-06 NOTE — ED Notes (Signed)
Pt. Stated, I have a knot in my navel area for 2 days.

## 2012-09-06 NOTE — Progress Notes (Signed)
[facility] [address] [place] [address] [phone]  Date:  09/06/2012   Name:  William Hutchinson   DOB:  August 22, 1945   MRN:  [id number]  PCP:  No PCP Per Patient    Chief Complaint: Knee Pain and Mass   History of Present Illness:  William Hutchinson is a 67 y.o. very pleasant male patient who presents with the following:  Two problems today.  Has a known spontaneously reducible umbilical hernia.  For past 36 hours has a golf ball sized painful and tender mass superior to umbilical hernia not reducible.  No nausea or vomiting no stool change, fever or chills.   Has a tender, swollen, painful left knee.  No history of injury.  History of known meniscus tear.  Says his knee clicks but never locked and is now unable to bear weight without assistance.    There are no active problems to display for this patient.   Past Medical History  Diagnosis Date  . Arthritis   . Allergy     Past Surgical History  Procedure Laterality Date  . Appendectomy      History  Substance Use Topics  . Smoking status: Never Smoker   . Smokeless tobacco: Not on file  . Alcohol Use: Yes    Family History  Problem Relation Age of Onset  . Arthritis Mother   . Dementia Mother     No Known Allergies  Medication list has been reviewed and updated.  Current Outpatient Prescriptions on File Prior to Visit  Medication Sig Dispense Refill  . silver sulfADIAZINE (SILVADENE) 1 % cream Apply topically 2 (two) times daily.  50 g  1  . cyclobenzaprine (FLEXERIL) 10 MG tablet Take 1 tablet (10 mg total) by mouth 2 (two) times daily as needed for muscle spasms.  30 tablet  0   No current facility-administered medications on file prior to visit.    Review of Systems:  As per HPI, otherwise negative.    Physical Examination: Filed Vitals:   09/06/12 1331  BP: 143/86  Pulse: 77  Temp: 99 F (37.2 C)  Resp: 18   Filed Vitals:   09/06/12 1331  Weight: 333 lb  (151.048 kg)   Body mass index is 43.94 kg/(m^2). Ideal Body Weight:    GEN: WDWN, NAD, Non-toxic, A & O x 3 HEENT: Atraumatic, Normocephalic. Neck supple. No masses, No LAD. Ears and Nose: No external deformity. CV: RRR, No M/G/R. No JVD. No thrill. No extra heart sounds. PULM: CTA B, no wheezes, crackles, rhonchi. No retractions. No resp. distress. No accessory muscle use. ABD: S, NT, ND, +BS. No rebound. No HSM.  Open umbilical hernia.  golf ball sized mass superior to and adjacent to the hernia.  Peau d'orange superior to umbilicus. EXTR: No c/c.  Evidence of venous stasis dermatitis.  Left knee is tender and swollen and guards.   NEURO Normal gait.  PSYCH: Normally interactive. Conversant. Not depressed or anxious appearing.  Calm demeanor.    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

## 2012-09-06 NOTE — ED Provider Notes (Incomplete)
CSN: [id number]     Arrival date & time 09/06/12  1445 History     First MD Initiated Contact with Patient 09/06/12 1512     Chief Complaint  Patient presents with  . Abdominal Pain   (Consider location/radiation/quality/duration/timing/severity/associated sxs/prior Treatment) HPI 67 year old male has a known his reproducible soft nontender umbilical hernia for months then woke up yesterday morning realizing that he has a new supraumbilical tender golf-ball sized mass separate from his umbilical hernia, he is no other abdominal pain he has no abdominal pain without palpation of the mass, he is no nausea vomiting diarrhea he is no testicular pain no dysuria no bloody stools no difficulty eating no chest pain no shortness of breath no other concerns no treatment prior to arrival and no pain without palpation of the mass. Past Medical History  Diagnosis Date  . Arthritis   . Allergy    stasis dermatitis Past Surgical History  Procedure Laterality Date  . Appendectomy     Family History  Problem Relation Age of Onset  . Arthritis Mother   . Dementia Mother    History  Substance Use Topics  . Smoking status: Never Smoker   . Smokeless tobacco: Not on file  . Alcohol Use: Yes    Review of Systems 10 Systems reviewed and are negative for acute change except as noted in the HPI. Allergies  Review of patient's allergies indicates no known allergies.  Home Medications   Current Outpatient Rx  Name  Route  Sig  Dispense  Refill  . cyclobenzaprine (FLEXERIL) 10 MG tablet   Oral   Take 1 tablet (10 mg total) by mouth 2 (two) times daily as needed for muscle spasms.   30 tablet   0   . HYDROcodone-acetaminophen (NORCO) 5-325 MG per tablet   Oral   Take 1-2 tablets by mouth every 4 (four) hours as needed for pain.   30 tablet   0   . silver sulfADIAZINE (SILVADENE) 1 % cream   Topical   Apply topically 2 (two) times daily.   50 g   1    BP 199/83  Pulse 114  Temp(Src)  98 F (36.7 C) (Oral)  Resp 20  SpO2 96% Physical Exam  Nursing note and vitals reviewed. Constitutional:  Awake, alert, nontoxic appearance.  HENT:  Head: Atraumatic.  Eyes: Right eye exhibits no discharge. Left eye exhibits no discharge.  Neck: Neck supple.  Cardiovascular: Normal rate and regular rhythm.   No murmur heard. Pulmonary/Chest: Effort normal and breath sounds normal. No respiratory distress. He has no wheezes. He has no rales. He exhibits no tenderness.  Abdominal: Soft. Bowel sounds are normal. He exhibits mass. There is tenderness. There is no rebound and no guarding.  Soft nontender easily reducible umbilical hernia, supraumbilical region reveals a approximately 2 cm diameter firm mildly tender mass with the rest of the abdomen nontender with bowel sounds present  Genitourinary:  Testicles nontender no palpable inguinal hernias  Musculoskeletal: He exhibits no tenderness.  Baseline ROM, no obvious new focal weakness. Baseline chronic stasis dermatitis hyperpigmentation of his lower legs  Neurological: He is alert.  Mental status and motor strength appears baseline for patient and situation.  Skin: No rash noted.  Psychiatric: He has a normal mood and affect.    ED Course   Patient understands and agrees with initial ED impression and plan with expectations set for ED visit. After I had already seen the patient and entered orders, nursing notified  me the patient was never supposed to be in the emergency department in the first place and was supposed to have had an outpatient CT scan ordered by [facility].  Procedures (including critical care time)  Labs Reviewed  CBC WITH DIFFERENTIAL  URINALYSIS, ROUTINE W REFLEX MICROSCOPIC   No results found. No diagnosis found.  MDM  ***

## 2012-09-06 NOTE — ED Notes (Signed)
Seen at Urgent care for umbilical hernia. Sent to [facility] for outpatient CT of abdomen. Post CT sent to ED for workup and to see general surgery for possible incarceration. Comes to ED from CT department with IV to right forearm.

## 2012-09-06 NOTE — ED Provider Notes (Signed)
CSN: [id number]     Arrival date & time 09/06/12  1722 History     First MD Initiated Contact with Patient 09/06/12 1728     Chief Complaint  Patient presents with  . Hernia   (Consider location/radiation/quality/duration/timing/severity/associated sxs/prior Treatment) HPI Comments: Patient presents to the emergency department with chief complaint of possible hernia incarceration. Patient was sent from an outpatient clinic to get a CT of the abdomen. Following CT, the patient was transferred to the emergency department for surgical consultation. Patient states that he noticed a golf ball size mass just above his umbilicus yesterday morning. He states that the area is firm and mildly tender to the touch. He has had a known hernia in the same general location, which is easily reducible. He denies any radiating symptoms, denies any fevers, chills, nausea, or vomiting. No pain in the testicles  The history is provided by the patient. No language interpreter was used.    Past Medical History  Diagnosis Date  . Arthritis   . Allergy    Past Surgical History  Procedure Laterality Date  . Appendectomy     Family History  Problem Relation Age of Onset  . Arthritis Mother   . Dementia Mother    History  Substance Use Topics  . Smoking status: Never Smoker   . Smokeless tobacco: Not on file  . Alcohol Use: Yes    Review of Systems  All other systems reviewed and are negative.    Allergies  Review of patient's allergies indicates no known allergies.  Home Medications   Current Outpatient Rx  Name  Route  Sig  Dispense  Refill  . cyclobenzaprine (FLEXERIL) 10 MG tablet   Oral   Take 1 tablet (10 mg total) by mouth 2 (two) times daily as needed for muscle spasms.   30 tablet   0   . HYDROcodone-acetaminophen (NORCO) 5-325 MG per tablet   Oral   Take 1-2 tablets by mouth every 4 (four) hours as needed for pain.   30 tablet   0   . silver sulfADIAZINE (SILVADENE) 1 %  cream   Topical   Apply topically 2 (two) times daily.   50 g   1    BP 156/79  Pulse 104  Temp(Src) 98.6 F (37 C) (Oral)  Ht 6\' 1"  (1.854 m)  Wt 333 lb (151.048 kg)  BMI 43.94 kg/m2  SpO2 99% Physical Exam  Nursing note and vitals reviewed. Constitutional: He is oriented to person, place, and time. He appears well-developed and well-nourished.  Obese  HENT:  Head: Normocephalic and atraumatic.  Right Ear: External ear normal.  Left Ear: External ear normal.  Nose: Nose normal.  Mouth/Throat: Oropharynx is clear and moist. No oropharyngeal exudate.  Eyes: Conjunctivae and EOM are normal. Pupils are equal, round, and reactive to light. Right eye exhibits no discharge. Left eye exhibits no discharge. No scleral icterus.  Neck: Normal range of motion. Neck supple. No JVD present.  Cardiovascular: Normal rate, regular rhythm, normal heart sounds and intact distal pulses.  Exam reveals no gallop and no friction rub.   No murmur heard. Pulmonary/Chest: Effort normal and breath sounds normal. No respiratory distress. He has no wheezes. He has no rales. He exhibits no tenderness.  Abdominal: Soft. Bowel sounds are normal. He exhibits mass. He exhibits no distension. There is no tenderness. There is no rebound and no guarding.  Golf-ball sized mass in the upper left side of the umbilicus, adjacent to known  umbilical hernia, which is easily reducible  Musculoskeletal: Normal range of motion. He exhibits no edema and no tenderness.  Neurological: He is alert and oriented to person, place, and time. He has normal reflexes.  CN 3-12 intact  Skin: Skin is warm and dry.  Psychiatric: He has a normal mood and affect. His behavior is normal. Judgment and thought content normal.    ED Course   Procedures (including critical care time)  Labs Reviewed  CBC WITH DIFFERENTIAL  COMPREHENSIVE METABOLIC PANEL  URINALYSIS, ROUTINE W REFLEX MICROSCOPIC   Results for orders placed during the  hospital encounter of 09/06/12  CBC WITH DIFFERENTIAL      Result Value Range   WBC 7.7  4.0 - 10.5 K/uL   RBC 4.98  4.22 - 5.81 MIL/uL   Hemoglobin 15.3  13.0 - 17.0 g/dL   HCT 11.9  14.7 - 82.9 %   MCV 91.8  78.0 - 100.0 fL   MCH 30.7  26.0 - 34.0 pg   MCHC 33.5  30.0 - 36.0 g/dL   RDW 56.2  13.0 - 86.5 %   Platelets 172  150 - 400 K/uL   Neutrophils Relative % 63  43 - 77 %   Neutro Abs 4.8  1.7 - 7.7 K/uL   Lymphocytes Relative 24  12 - 46 %   Lymphs Abs 1.8  0.7 - 4.0 K/uL   Monocytes Relative 12  3 - 12 %   Monocytes Absolute 0.9  0.1 - 1.0 K/uL   Eosinophils Relative 2  0 - 5 %   Eosinophils Absolute 0.1  0.0 - 0.7 K/uL   Basophils Relative 0  0 - 1 %   Basophils Absolute 0.0  0.0 - 0.1 K/uL  COMPREHENSIVE METABOLIC PANEL      Result Value Range   Sodium 137  135 - 145 mEq/L   Potassium 3.8  3.5 - 5.1 mEq/L   Chloride 100  96 - 112 mEq/L   CO2 26  19 - 32 mEq/L   Glucose, Bld 91  70 - 99 mg/dL   BUN 16  6 - 23 mg/dL   Creatinine, Ser 7.84  0.50 - 1.35 mg/dL   Calcium 9.1  8.4 - 69.6 mg/dL   Total Protein 8.2  6.0 - 8.3 g/dL   Albumin 3.5  3.5 - 5.2 g/dL   AST 42 (*) 0 - 37 U/L   ALT 29  0 - 53 U/L   Alkaline Phosphatase 86  39 - 117 U/L   Total Bilirubin 0.6  0.3 - 1.2 mg/dL   GFR calc non Af Amer 87 (*) >90 mL/min   GFR calc Af Amer >90  >90 mL/min  URINALYSIS, ROUTINE W REFLEX MICROSCOPIC      Result Value Range   Color, Urine YELLOW  YELLOW   APPearance CLEAR  CLEAR   Specific Gravity, Urine 1.010  1.005 - 1.030   pH 6.0  5.0 - 8.0   Glucose, UA NEGATIVE  NEGATIVE mg/dL   Hgb urine dipstick NEGATIVE  NEGATIVE   Bilirubin Urine NEGATIVE  NEGATIVE   Ketones, ur NEGATIVE  NEGATIVE mg/dL   Protein, ur NEGATIVE  NEGATIVE mg/dL   Urobilinogen, UA 1.0  0.0 - 1.0 mg/dL   Nitrite NEGATIVE  NEGATIVE   Leukocytes, UA NEGATIVE  NEGATIVE   Ct Abdomen W Contrast  09/06/2012   CLINICAL DATA:  Protruding painful anterior abdominal wall knot. Evaluate for strangulated  hernia.  EXAM: CT ABDOMEN WITH  CONTRAST  TECHNIQUE: Multidetector CT imaging of the abdomen was performed using the standard protocol following bolus administration of intravenous contrast.  CONTRAST:  OMNIPAQUE IOHEXOL 300 MG/ML  SOLN  COMPARISON:  Abdominal pelvic CT 06/28/2010.  FINDINGS: The lung bases are clear. There is no pleural or pericardial effusion. A small hiatal hernia is noted.  The liver demonstrates mildly decreased density consistent with steatosis. No focal lesions are identified. The gallbladder, pancreas, spleen and adrenal glands appear unremarkable.  There are nonobstructing renal calculi bilaterally. These have slightly enlarged from the prior study. In the lower pole of the left kidney is a 1.4 cm cyst. There is no hydronephrosis.  Umbilical hernia containing fat has mildly enlarged in the interval, measuring up to 6.5 cm transverse. There is new soft tissue stranding throughout the herniated fat superiorly which could reflect incarceration or inflammation. There is no herniated bowel. There is no evidence of bowel obstruction. No significant vascular findings are demonstrated. There is no lymphadenopathy.  Degenerative changes are noted throughout the lumbar spine. No acute osseous findings are seen. The pelvis was not imaged.  IMPRESSION: 1. Enlarging periumbilical hernia containing only fat. The herniated fat demonstrates new soft tissue stranding which may indicate incarceration or inflammation. There is no herniated bowel.  2. Enlarging nonobstructing bilateral renal calculi. No hydronephrosis.  These results were called by telephone on 09/06/2012 at 1710 hr to Dr. Dareen Piano, who verbally acknowledged these results.   Electronically Signed   By: Roxy Horseman   On: 09/06/2012 17:08     Ct Abdomen W Contrast  09/06/2012   CLINICAL DATA:  Protruding painful anterior abdominal wall knot. Evaluate for strangulated hernia.  EXAM: CT ABDOMEN WITH CONTRAST  TECHNIQUE: Multidetector CT  imaging of the abdomen was performed using the standard protocol following bolus administration of intravenous contrast.  CONTRAST:  OMNIPAQUE IOHEXOL 300 MG/ML  SOLN  COMPARISON:  Abdominal pelvic CT 06/28/2010.  FINDINGS: The lung bases are clear. There is no pleural or pericardial effusion. A small hiatal hernia is noted.  The liver demonstrates mildly decreased density consistent with steatosis. No focal lesions are identified. The gallbladder, pancreas, spleen and adrenal glands appear unremarkable.  There are nonobstructing renal calculi bilaterally. These have slightly enlarged from the prior study. In the lower pole of the left kidney is a 1.4 cm cyst. There is no hydronephrosis.  Umbilical hernia containing fat has mildly enlarged in the interval, measuring up to 6.5 cm transverse. There is new soft tissue stranding throughout the herniated fat superiorly which could reflect incarceration or inflammation. There is no herniated bowel. There is no evidence of bowel obstruction. No significant vascular findings are demonstrated. There is no lymphadenopathy.  Degenerative changes are noted throughout the lumbar spine. No acute osseous findings are seen. The pelvis was not imaged.  IMPRESSION: 1. Enlarging periumbilical hernia containing only fat. The herniated fat demonstrates new soft tissue stranding which may indicate incarceration or inflammation. There is no herniated bowel.  2. Enlarging nonobstructing bilateral renal calculi. No hydronephrosis.  These results were called by telephone on 09/06/2012 at 1710 hr to Dr. Dareen Piano, who verbally acknowledged these results.   Electronically Signed   By: Roxy Horseman   On: 09/06/2012 17:08   1. Hernia     MDM  Patient with hernia, and incarceration of fatty tissue likely omentum. I spoke with Dr. Donell Beers from surgery, who recommends outpatient followup. She states that the surgical Center contact the patient tomorrow morning and arrange an  appointment.  Dr. Donell Beers states that no emergent or urgent surgical intervention is needed at this time. Discussed the plan with the patient, who understands and agrees. Patient is stable and ready for discharge.  Discussed the patient with Dr. Micheline Maze, who agrees with the plan.  Roxy Horseman, PA-C 09/06/12 2057

## 2012-09-07 NOTE — ED Provider Notes (Signed)
Medical screening examination/treatment/procedure(s) were performed by non-physician practitioner and as supervising physician I was immediately available for consultation/collaboration.   Megan E Docherty, MD 09/07/12 1221 

## 2012-09-11 ENCOUNTER — Encounter (INDEPENDENT_AMBULATORY_CARE_PROVIDER_SITE_OTHER): Payer: Self-pay | Admitting: General Surgery

## 2012-09-11 ENCOUNTER — Ambulatory Visit (INDEPENDENT_AMBULATORY_CARE_PROVIDER_SITE_OTHER): Payer: Medicare Other | Admitting: General Surgery

## 2012-09-11 VITALS — BP 138/86 | HR 100 | Temp 97.1°F | Resp 15 | Ht 73.0 in | Wt 339.0 lb

## 2012-09-11 DIAGNOSIS — K420 Umbilical hernia with obstruction, without gangrene: Secondary | ICD-10-CM

## 2012-09-11 NOTE — Progress Notes (Signed)
Patient ID: William Hutchinson, male   DOB: Oct 10, 1945, 68 y.o.   MRN: [id number]  Chief Complaint  Patient presents with  . New Evaluation    eval umb hernia    HPI William Hutchinson is a 67 y.o. male.  This patient is referred by Dr. Micheline Maze in the emergency room and Dr. Dareen Piano at the urgent care for evaluation of an incarcerated umbilical hernia. He says that he has noticed an umbilical hernia for over one year now and it has probably increased in size since that time however it has been relatively asymptomatic except for some mild tenderness but about one week ago he was seen in the emergency room because he noticed that the bulge would not reduce and it had become hard. He says that it still really does not cause much symptoms other than some mild tenderness and he denies any obstructive symptoms such as nausea and vomiting and he states that his bowels are normal. He denies any fevers or chills. In the emergency room he had a CT scan which demonstrated a fat-containing hernia with some stranding in the subcutaneous tissues concerning for incarceration or stimulation but he was discharged with surgical followup. HPI  Past Medical History  Diagnosis Date  . Arthritis   . Allergy     Past Surgical History  Procedure Laterality Date  . Appendectomy      Family History  Problem Relation Age of Onset  . Arthritis Mother   . Dementia Mother     Social History History  Substance Use Topics  . Smoking status: Never Smoker   . Smokeless tobacco: Never Used  . Alcohol Use: Yes    No Known Allergies  No current outpatient prescriptions on file.   No current facility-administered medications for this visit.    Review of Systems Review of Systems All other review of systems negative or noncontributory except as stated in the HPI  Blood pressure 138/86, pulse 100, temperature 97.1 F (36.2 C), temperature source Temporal, resp. rate 15, height 6\' 1"  (1.854 m), weight 339 lb  (153.769 kg).  Physical Exam Physical Exam Physical Exam  Vitals reviewed. Constitutional: He is oriented to person, place, and time. He appears well-developed and well-nourished. No distress.  HENT:  Head: Normocephalic and atraumatic.  Mouth/Throat: No oropharyngeal exudate.  Eyes: Conjunctivae and EOM are normal. Pupils are equal, round, and reactive to light. Right eye exhibits no discharge. Left eye exhibits no discharge. No scleral icterus.  Neck: Normal range of motion. No tracheal deviation present.  Cardiovascular: Normal rate, regular rhythm and normal heart sounds.   Pulmonary/Chest: Effort normal and breath sounds normal. No stridor. No respiratory distress. He has no wheezes. He has no rales. He exhibits no tenderness.  Abdominal: Soft. Bowel sounds are normal. He exhibits no distension. There is no tenderness. There is no rebound and no guarding. he has a moderate-sized umbilical hernia as well as a palpable bulge or mass just cephalad to his umbilicus. This is really nontender although it is firm and is likely consistent with an incarcerated umbilical hernia. There is no evidence of skin changes or signs of strangulation of there is no peritonitis or sign of incarcerated intestines Musculoskeletal: Normal range of motion. He exhibits no edema and no tenderness.  Neurological: He is alert and oriented to person, place, and time.  Skin: Skin is warm and dry. No rash noted. He is not diaphoretic. No erythema. No pallor.  Psychiatric: He has a normal mood and  affect. His behavior is normal. Judgment and thought content normal.    Data Reviewed CT  Assessment    Incarcerated umbilical hernia With a long discussion regarding his diagnoses and the treatment options. I did review his CT scan with the patient. He appears to have an incarcerated and even possibly strangulated fat-containing umbilical hernia. However he is relatively asymptomatic at this time. I discussed with him the  options for continued observation versus laparoscopic or open umbilical hernia repair with mesh and I have recommended surgical repair. He is interested in having this repaired probably with an open technique but he would like to take care of some business with work and he would not like to schedule his surgery at this time. We did discuss the risk of possible intestinal incarceration and strangulate she is which would require an emergent and possibly life-threatening situation and he expressed understanding of this. We also discussed the pros and cons of both open and laparoscopic surgery including infection, bleeding, pain, scarring, poor cosmesis, bowel injury, recurrence, and seroma and persistent bulge and he expressed understanding and would like to schedule his surgery but probably at the end of October or early November.    Plan    He would like to take some time to take care of some things at work and he said that he would call me back in October when he would like to have this scheduled. He will return to the emergency room if he has any signs of obstruction or increasing pain in the area        Geni Skorupski DAVID 09/11/2012, 12:22 PM

## 2012-11-18 ENCOUNTER — Ambulatory Visit (INDEPENDENT_AMBULATORY_CARE_PROVIDER_SITE_OTHER): Payer: Medicare Other | Admitting: Emergency Medicine

## 2012-11-18 VITALS — BP 134/76 | HR 124 | Temp 99.5°F | Resp 20 | Ht 73.5 in | Wt 333.2 lb

## 2012-11-18 DIAGNOSIS — N529 Male erectile dysfunction, unspecified: Secondary | ICD-10-CM

## 2012-11-18 DIAGNOSIS — R350 Frequency of micturition: Secondary | ICD-10-CM

## 2012-11-18 DIAGNOSIS — R300 Dysuria: Secondary | ICD-10-CM

## 2012-11-18 DIAGNOSIS — N3000 Acute cystitis without hematuria: Secondary | ICD-10-CM

## 2012-11-18 LAB — POCT URINALYSIS DIPSTICK
Protein, UA: 100
Urobilinogen, UA: 1

## 2012-11-18 LAB — POCT UA - MICROSCOPIC ONLY: Crystals, Ur, HPF, POC: NEGATIVE

## 2012-11-18 MED ORDER — PHENAZOPYRIDINE HCL 200 MG PO TABS: 200.0000 mg | ORAL_TABLET | Freq: Three times a day (TID) | ORAL | Status: DC | PRN

## 2012-11-18 MED ORDER — SULFAMETHOXAZOLE-TRIMETHOPRIM 800-160 MG PO TABS: 1.0000 | ORAL_TABLET | Freq: Two times a day (BID) | ORAL | Status: DC

## 2012-11-18 NOTE — Progress Notes (Signed)
[facility] [address] [place] [address] [phone]  Date:  11/18/2012   Name:  William Hutchinson   DOB:  1945-10-13   MRN:  [id number]  PCP:  Pcp Not In System    Chief Complaint: Urinary Tract Infection   History of Present Illness:  William Hutchinson is a 67 y.o. very pleasant male patient who presents with the following:  At work last night was incontinent of urine due to urge.  Had dysuria, frequency and a strong odor.  No fever or chills.. No history of STD infection.  No GI symptoms.  No improvement with over the counter medications or other home remedies. Denies other complaint or health concern today.   There are no active problems to display for this patient.   Past Medical History  Diagnosis Date  . Arthritis   . Allergy     Past Surgical History  Procedure Laterality Date  . Appendectomy      History  Substance Use Topics  . Smoking status: Never Smoker   . Smokeless tobacco: Never Used  . Alcohol Use: Yes    Family History  Problem Relation Age of Onset  . Arthritis Mother   . Dementia Mother     No Known Allergies  Medication list has been reviewed and updated.  No current outpatient prescriptions on file prior to visit.   No current facility-administered medications on file prior to visit.    Review of Systems:  As per HPI, otherwise negative.    Physical Examination: Filed Vitals:   11/18/12 1653  BP: 134/76  Pulse: 124  Temp: 99.5 F (37.5 C)  Resp: 20   Filed Vitals:   11/18/12 1653  Height: 6' 1.5" (1.867 m)  Weight: 333 lb 3.2 oz (151.139 kg)   Body mass index is 43.36 kg/(m^2). Ideal Body Weight: Weight in (lb) to have BMI = 25: 191.7  GEN: WDWN, NAD, Non-toxic, A & O x 3 HEENT: Atraumatic, Normocephalic. Neck supple. No masses, No LAD. Ears and Nose: No external deformity. CV: RRR, No M/G/R. No JVD. No thrill. No extra heart sounds. PULM: CTA B, no wheezes, crackles, rhonchi. No  retractions. No resp. distress. No accessory muscle use. ABD: S, NT, ND, +BS. No rebound. No HSM. EXTR: No c/c/e NEURO Normal gait.  PSYCH: Normally interactive. Conversant. Not depressed or anxious appearing.  Calm demeanor.    Assessment and Plan: Acute cystitis Septra Pyridium  Signed,  Phillips Odor, MD   Results for orders placed in visit on 11/18/12  POCT UA - MICROSCOPIC ONLY      Result Value Range   WBC, Ur, HPF, POC tntc     RBC, urine, microscopic 0-1     Bacteria, U Microscopic 3+     Mucus, UA small     Epithelial cells, urine per micros 3-7     Crystals, Ur, HPF, POC neg     Casts, Ur, LPF, POC neg     Yeast, UA neg    POCT URINALYSIS DIPSTICK      Result Value Range   Color, UA yellow     Clarity, UA hazy     Glucose, UA neg     Bilirubin, UA neg     Ketones, UA trace     Spec Grav, UA 1.020     Blood, UA moderate     pH, UA 7.0     Protein, UA 100     Urobilinogen, UA  1.0     Nitrite, UA positive     Leukocytes, UA moderate (2+)

## 2012-11-18 NOTE — Patient Instructions (Signed)
Urinary Tract Infection  Urinary tract infections (UTIs) can develop anywhere along your urinary tract. Your urinary tract is your body's drainage system for removing wastes and extra water. Your urinary tract includes two kidneys, two ureters, a bladder, and a urethra. Your kidneys are a pair of bean-shaped organs. Each kidney is about the size of your fist. They are located below your ribs, one on each side of your spine.  CAUSES  Infections are caused by microbes, which are microscopic organisms, including fungi, viruses, and bacteria. These organisms are so small that they can only be seen through a microscope. Bacteria are the microbes that most commonly cause UTIs.  SYMPTOMS   Symptoms of UTIs may vary by age and gender of the patient and by the location of the infection. Symptoms in young women typically include a frequent and intense urge to urinate and a painful, burning feeling in the bladder or urethra during urination. Older women and men are more likely to be tired, shaky, and weak and have muscle aches and abdominal pain. A fever may mean the infection is in your kidneys. Other symptoms of a kidney infection include pain in your back or sides below the ribs, nausea, and vomiting.  DIAGNOSIS  To diagnose a UTI, your caregiver will ask you about your symptoms. Your caregiver also will ask to provide a urine sample. The urine sample will be tested for bacteria and white blood cells. White blood cells are made by your body to help fight infection.  TREATMENT   Typically, UTIs can be treated with medication. Because most UTIs are caused by a bacterial infection, they usually can be treated with the use of antibiotics. The choice of antibiotic and length of treatment depend on your symptoms and the type of bacteria causing your infection.  HOME CARE INSTRUCTIONS   If you were prescribed antibiotics, take them exactly as your caregiver instructs you. Finish the medication even if you feel better after you  have only taken some of the medication.   Drink enough water and fluids to keep your urine clear or pale yellow.   Avoid caffeine, tea, and carbonated beverages. They tend to irritate your bladder.   Empty your bladder often. Avoid holding urine for long periods of time.   Empty your bladder before and after sexual intercourse.   After a bowel movement, women should cleanse from front to back. Use each tissue only once.  SEEK MEDICAL CARE IF:    You have back pain.   You develop a fever.   Your symptoms do not begin to resolve within 3 days.  SEEK IMMEDIATE MEDICAL CARE IF:    You have severe back pain or lower abdominal pain.   You develop chills.   You have nausea or vomiting.   You have continued burning or discomfort with urination.  MAKE SURE YOU:    Understand these instructions.   Will watch your condition.   Will get help right away if you are not doing well or get worse.  Document Released: 10/10/2004 Document Revised: 07/02/2011 Document Reviewed: 02/08/2011  [employer] Patient Information 2014 [employer]

## 2012-11-18 NOTE — Addendum Note (Signed)
Addended by: Cydney Ok on: 11/18/2012 06:19 PM   Modules accepted: Orders

## 2012-11-24 ENCOUNTER — Ambulatory Visit (INDEPENDENT_AMBULATORY_CARE_PROVIDER_SITE_OTHER): Payer: Medicare Other | Admitting: Family Medicine

## 2012-11-24 VITALS — BP 144/87 | HR 110 | Temp 98.9°F | Resp 18 | Ht 73.0 in | Wt 337.0 lb

## 2012-11-24 DIAGNOSIS — N529 Male erectile dysfunction, unspecified: Secondary | ICD-10-CM

## 2012-11-24 DIAGNOSIS — N390 Urinary tract infection, site not specified: Secondary | ICD-10-CM

## 2012-11-24 DIAGNOSIS — R350 Frequency of micturition: Secondary | ICD-10-CM

## 2012-11-24 LAB — POCT UA - MICROSCOPIC ONLY: Yeast, UA: NEGATIVE

## 2012-11-24 LAB — POCT URINALYSIS DIPSTICK
Bilirubin, UA: NEGATIVE
Glucose, UA: NEGATIVE
Nitrite, UA: NEGATIVE

## 2012-11-24 MED ORDER — SULFAMETHOXAZOLE-TRIMETHOPRIM 800-160 MG PO TABS: 1.0000 | ORAL_TABLET | Freq: Two times a day (BID) | ORAL | Status: DC

## 2012-11-24 NOTE — Progress Notes (Signed)
  Subjective:    Patient ID: William Hutchinson, male    DOB: [birth year], 67 y.o.   MRN: [id number]  This chart was scribed for Elvina Sidle, MD by Blanchard Kelch, ED Scribe. The patient was seen in room 3. Patient's care was started at 8:34 PM.   HPI  William Hutchinson is a 67 y.o. male who presents to office for a follow up post visit for frequency. He was diagnosed with a UTI and prescribed sulfa antibiotics by Dr. Lyn Hollingshead. He was halfway through the course of antibiotics (three days) when he left the medication out of town and was unable to finish the course. 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 He is back today to refill the sulfa antibiotics. He states that the symptoms have subsided but would like to finish the course. He is also having erectile dysfunction disorder and would like a prescription of Testosterone to help with the problem.  He travels frequently for his work.   Review of Systems  Constitutional: Negative for fever.  HENT: Negative for drooling.   Eyes: Negative for discharge.  Respiratory: Negative for cough.   Cardiovascular: Negative for leg swelling.  Gastrointestinal: Negative for vomiting.  Endocrine: Negative for polyuria.  Genitourinary: Negative for hematuria.  Musculoskeletal: Negative for gait problem.  Skin: Negative for rash.  Allergic/Immunologic: Negative for immunocompromised state.  Neurological: Negative for speech difficulty.  Hematological: Negative for adenopathy.  Psychiatric/Behavioral: Negative for confusion.       Objective:   Physical Exam  Nursing note and vitals reviewed. Constitutional: He is oriented to person, place, and time. He appears well-developed and well-nourished. No distress.  HENT:  Head: Normocephalic and atraumatic.  Eyes: EOM are normal.  Neck: Neck supple. No tracheal deviation present.  Cardiovascular: Normal rate.   Pulmonary/Chest: Effort normal. No  respiratory distress.  Musculoskeletal: Normal range of motion.  Neurological: He is alert and oriented to person, place, and time.  Skin: Skin is warm and dry.  Psychiatric: He has a normal mood and affect. His behavior is normal.      Assessment & Plan:   I personally performed the services described in this documentation, which was scribed in my presence. The recorded information has been reviewed and is accurate.  Frequent urination - Plan: POCT UA - Microscopic Only, POCT urinalysis dipstick, sulfamethoxazole-trimethoprim (BACTRIM DS,SEPTRA DS) 800-160 MG per tablet  Erectile dysfunction - Plan: Testosterone  UTI (urinary tract infection)  Signed, Elvina Sidle, MD

## 2012-11-25 LAB — TESTOSTERONE: Testosterone: 79 ng/dL — ABNORMAL LOW (ref 300–890)

## 2012-11-26 ENCOUNTER — Other Ambulatory Visit: Payer: Self-pay | Admitting: Family Medicine

## 2012-11-26 DIAGNOSIS — E291 Testicular hypofunction: Secondary | ICD-10-CM

## 2012-12-09 ENCOUNTER — Telehealth: Payer: Self-pay

## 2012-12-09 NOTE — Telephone Encounter (Signed)
Patient is calling to follow up on his referral to urologist. Per note in Epic I told him we are waiting on [facility] to call back with a time. Can we please follow up with [facility] and call patient back? He says he has left several messages at the referral extension and has not gotten a call back yet.

## 2013-01-08 ENCOUNTER — Ambulatory Visit (INDEPENDENT_AMBULATORY_CARE_PROVIDER_SITE_OTHER): Payer: Medicare Other | Admitting: Internal Medicine

## 2013-01-08 VITALS — BP 150/100 | HR 105 | Temp 99.9°F | Resp 16 | Ht 74.5 in | Wt 341.0 lb

## 2013-01-08 DIAGNOSIS — E291 Testicular hypofunction: Secondary | ICD-10-CM

## 2013-01-08 DIAGNOSIS — R300 Dysuria: Secondary | ICD-10-CM

## 2013-01-08 DIAGNOSIS — N390 Urinary tract infection, site not specified: Secondary | ICD-10-CM

## 2013-01-08 DIAGNOSIS — Z8042 Family history of malignant neoplasm of prostate: Secondary | ICD-10-CM

## 2013-01-08 LAB — POCT UA - MICROSCOPIC ONLY
Casts, Ur, LPF, POC: NEGATIVE
Crystals, Ur, HPF, POC: NEGATIVE
Yeast, UA: POSITIVE

## 2013-01-08 LAB — POCT URINALYSIS DIPSTICK
Bilirubin, UA: NEGATIVE
Glucose, UA: NEGATIVE
Ketones, UA: NEGATIVE
Nitrite, UA: NEGATIVE
Protein, UA: 30
Spec Grav, UA: 1.02
Urobilinogen, UA: 0.2
pH, UA: 6

## 2013-01-08 MED ORDER — PHENAZOPYRIDINE HCL 200 MG PO TABS: 200.0000 mg | ORAL_TABLET | Freq: Three times a day (TID) | ORAL | Status: DC | PRN

## 2013-01-08 MED ORDER — CIPROFLOXACIN HCL 500 MG PO TABS: 500.0000 mg | ORAL_TABLET | Freq: Two times a day (BID) | ORAL | Status: DC

## 2013-01-08 NOTE — Progress Notes (Addendum)
   Subjective:    Patient ID: William Hutchinson, male    DOB: 14-Nov-1945, 67 y.o.   MRN: [id number]  HPI complaining of a 3-5 day history of dysuria with frequency and urgency Nocturia x4-5 during this interval No fever or flank pain No abdominal pain No penile discharge Past history of urinary tract infections since last summer   Enlarging nonobstructing bilateral renal calculi. No hydronephrosis on CT 8/14  U/a TNTC WBCs 11/14-no cult  No meds or chr problems  Father died in his late 29s and had been diagnosed with prostate cancer although this did not kill him  Review of Systems Noncontributory    Objective:   Physical Exam BP 150/100  Pulse 105  Temp(Src) 99.9 F (37.7 C) (Oral)  Resp 16  Ht 6' 2.5" (1.892 m)  Wt 341 lb (154.677 kg)  BMI 43.21 kg/m2  SpO2 98% HEENT clear Heart regular Abdomen nontender No flank tenderness to percussion  Results for orders placed in visit on 01/08/13  POCT UA - MICROSCOPIC ONLY      Result Value Range   WBC, Ur, HPF, POC tntc     RBC, urine, microscopic tntc     Bacteria, U Microscopic 1+     Mucus, UA mod     Epithelial cells, urine per micros 0-3     Crystals, Ur, HPF, POC neg     Casts, Ur, LPF, POC neg     Yeast, UA pos    POCT URINALYSIS DIPSTICK      Result Value Range   Color, UA yellow     Clarity, UA cloudy     Glucose, UA neg     Bilirubin, UA neg     Ketones, UA neg     Spec Grav, UA 1.020     Blood, UA mod     pH, UA 6.0     Protein, UA 30     Urobilinogen, UA 0.2     Nitrite, UA neg     Leukocytes, UA moderate (2+)            Assessment & Plan:  Pyuria Dysuria frequency Nocturia and dribbling History of nephrolithiasis Elevated blood pressure without diagnosis of hypertension-needs recheck and well Family history of prostate cancer  History of current problems demand a more thorough diagnosis and he will be referred to urology to rule out obstruction such as urethral stricture and prostate  enlargement versus nidus of infection around renal stone in renal pelvis  Meds ordered this encounter  Medications  . ciprofloxacin (CIPRO) 500 MG tablet    Sig: Take 1 tablet (500 mg total) by mouth 2 (two) times daily.    Dispense:  20 tablet    Refill:  0  . phenazopyridine (PYRIDIUM) 200 MG tablet    Sig: Take 1 tablet (200 mg total) by mouth 3 (three) times daily as needed for pain.    Dispense:  10 tablet    Refill:  0   Urine cultured [facility]   Addendum 01/12/2013 Urine culture positive for Proteus and sensitive only to Septra Prescription sent to his pharmacy

## 2013-01-11 LAB — URINE CULTURE: Colony Count: >=100000

## 2013-01-12 ENCOUNTER — Telehealth: Payer: Self-pay | Admitting: Radiology

## 2013-01-12 MED ORDER — SULFAMETHOXAZOLE-TMP DS 800-160 MG PO TABS: 1.0000 | ORAL_TABLET | Freq: Two times a day (BID) | ORAL | Status: DC

## 2013-01-12 NOTE — Addendum Note (Signed)
Addended by: Tonye Pearson on: 01/12/2013 05:46 PM   Modules accepted: Orders, Medications

## 2013-01-12 NOTE — Telephone Encounter (Signed)
Faxed labs and notes to [facility] patient is there now.

## 2014-09-23 IMAGING — CT CT ABDOMEN W/ CM
2 of 5 series · 16 of 46 positions shown, 18 images · IV contrast (APPLIED)
Comparison: Abdominal pelvic CT 06/28/2010.

CLINICAL DATA: Protruding painful anterior abdominal wall knot.
Evaluate for strangulated hernia.

EXAM:
CT ABDOMEN WITH CONTRAST
TECHNIQUE: Multidetector CT imaging of the abdomen was performed using the
standard protocol following bolus administration of intravenous
contrast.
CONTRAST:  125mL OMNIPAQUE IOHEXOL 300 MG/ML  SOLN

[Series 6: cor · coronal · 0.65mm/px · 3 of 199 slices shown]
[im 67/199  soft-tissue]
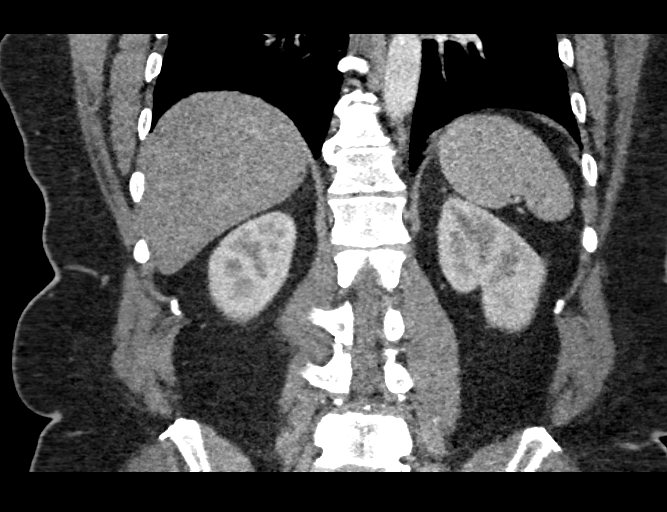
[im 89/199  soft-tissue]
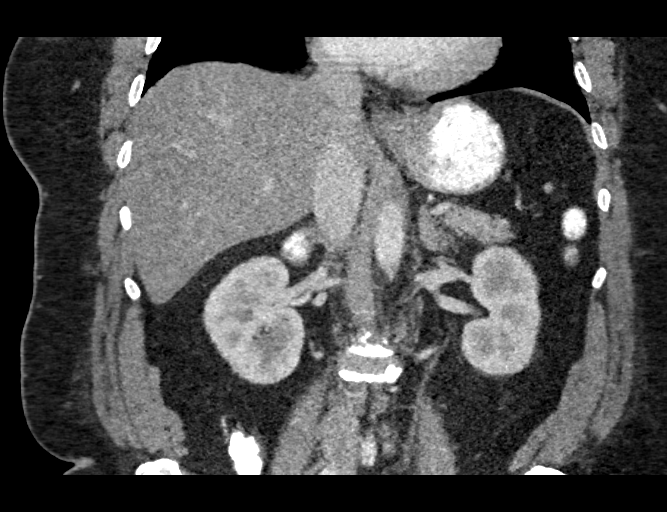
[im 111/199  soft-tissue]
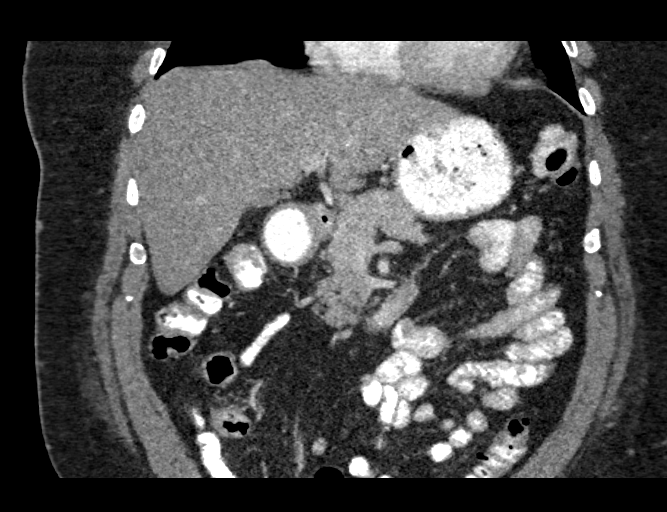

[Series 7: abd/ pelvis 5.0 i30f 2 · axial · 0.98mm/px · z∈[-490,-250]mm · 13 of 57 slices shown, 15 images]
[im 5/57  soft-tissue]
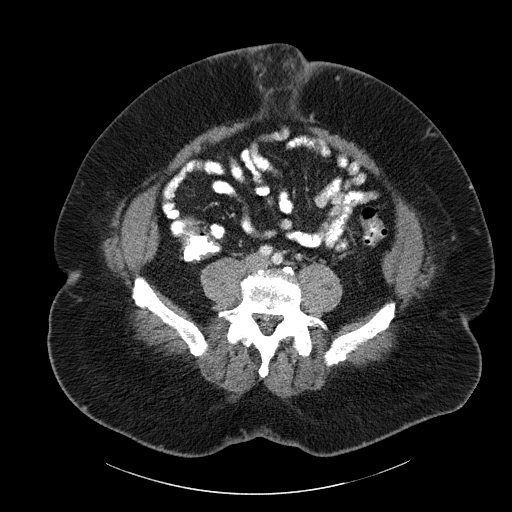
[im 5/57  bone]
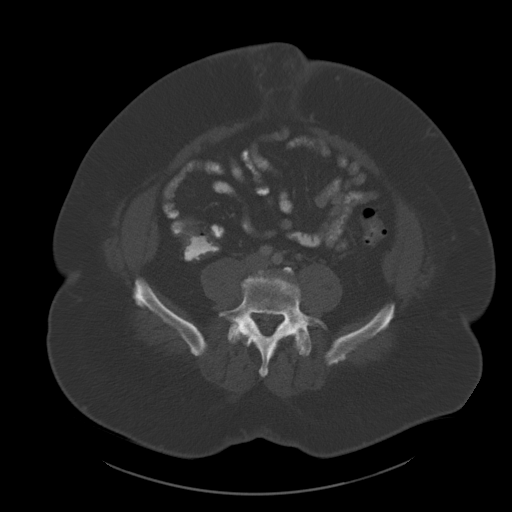
[im 9/57  soft-tissue]
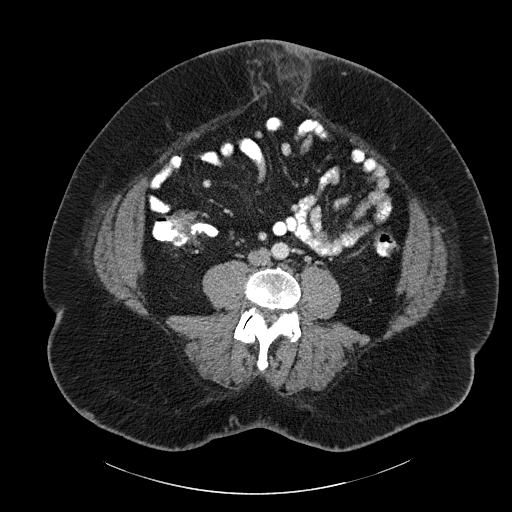
[im 13/57  soft-tissue]
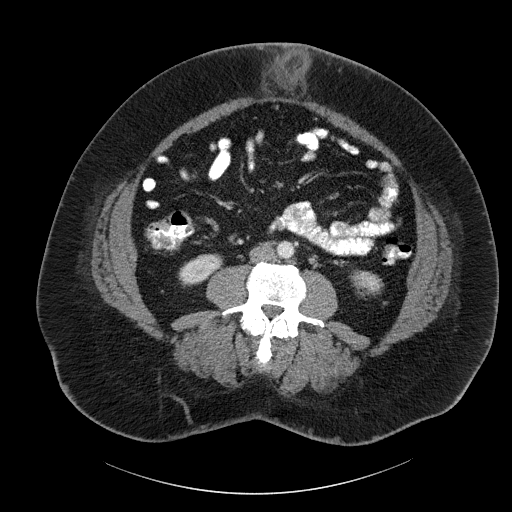
[im 17/57  soft-tissue]
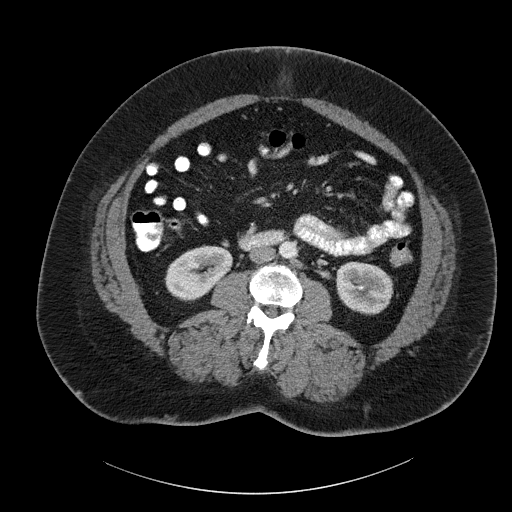
[im 21/57  soft-tissue]
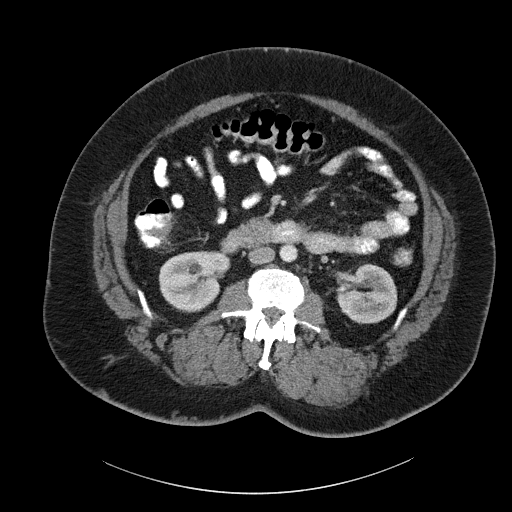
[im 25/57  soft-tissue]
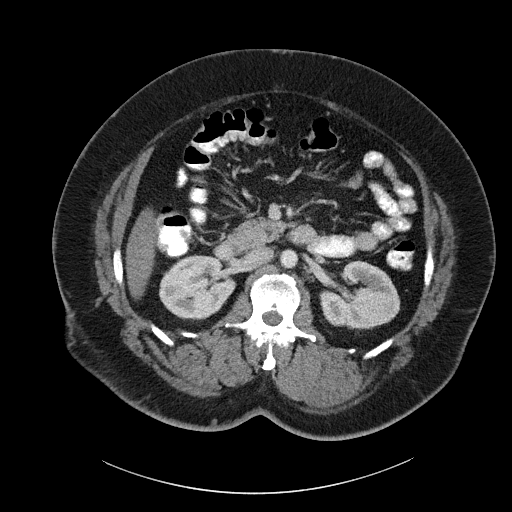
[im 29/57  soft-tissue]
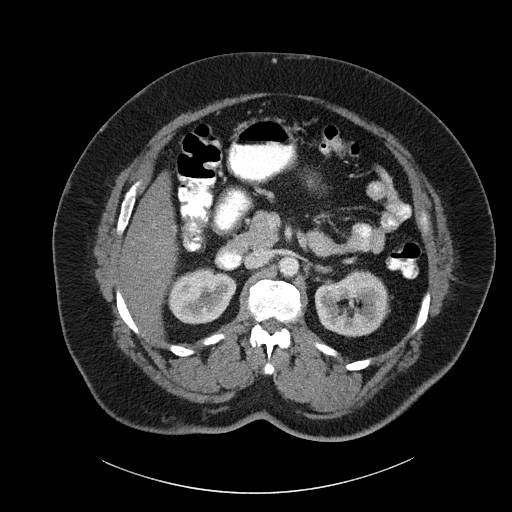
[im 33/57  soft-tissue]
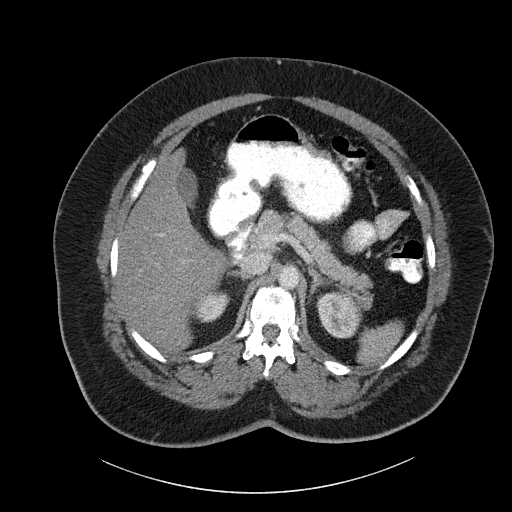
[im 37/57  soft-tissue]
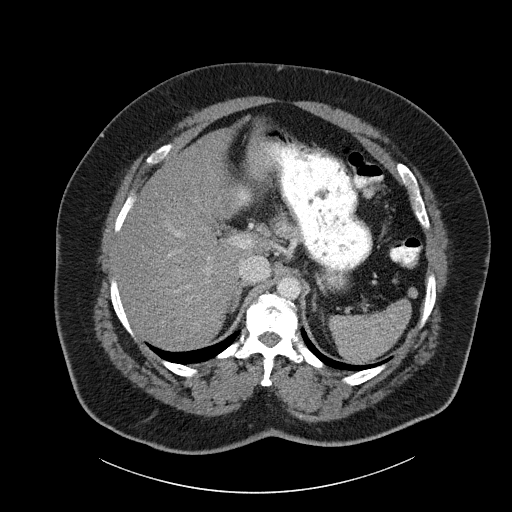
[im 37/57  bone]
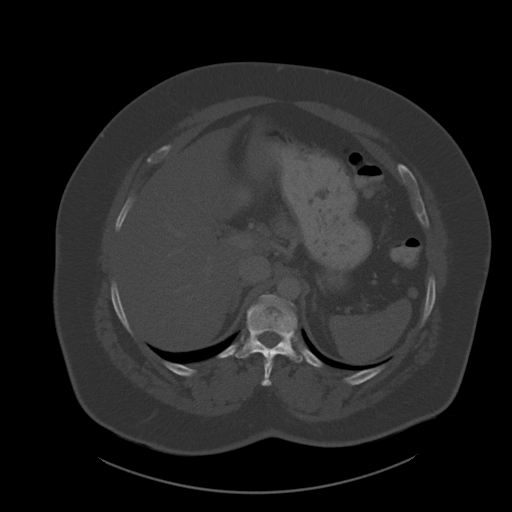
[im 41/57  soft-tissue]
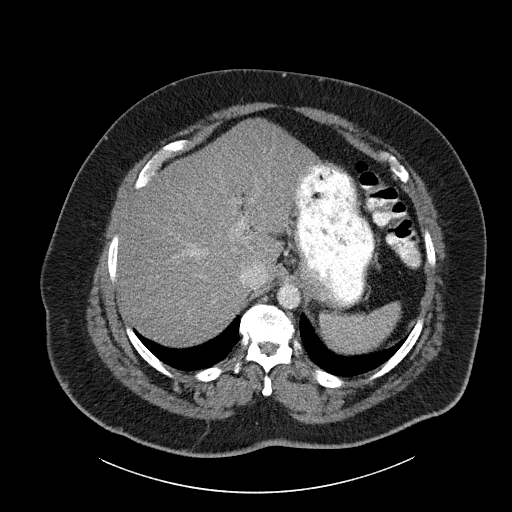
[im 45/57  soft-tissue]
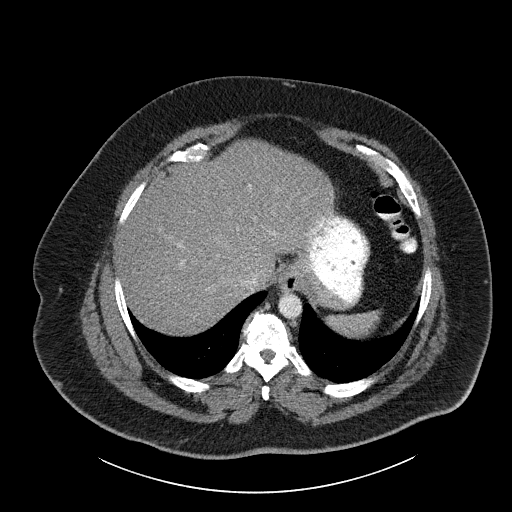
[im 49/57  soft-tissue]
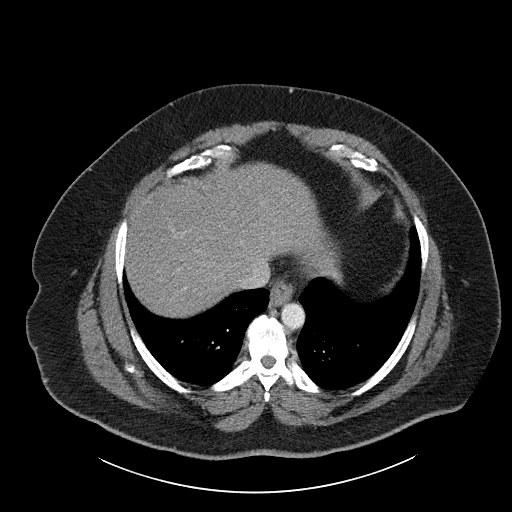
[im 53/57  soft-tissue]
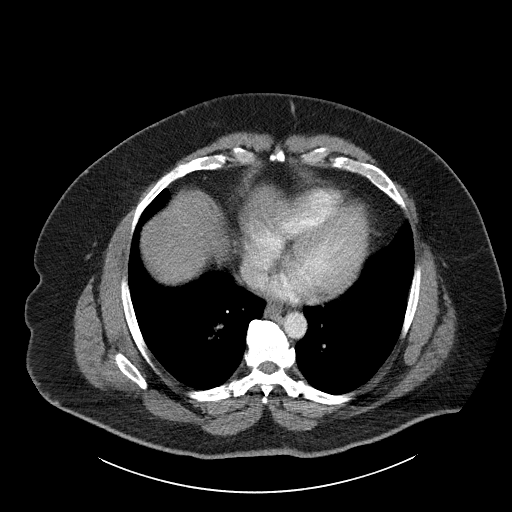

[16 of 46 positions shown; findings below may reference images not displayed]

FINDINGS: The lung bases are clear. There is no pleural or pericardial
effusion. A small hiatal hernia is noted.

The liver demonstrates mildly decreased density consistent with
steatosis. No focal lesions are identified. The gallbladder,
pancreas, spleen and adrenal glands appear unremarkable.

There are nonobstructing renal calculi bilaterally. These have
slightly enlarged from the prior study. In the lower pole of the
left kidney is a 1.4 cm cyst. There is no hydronephrosis.

Umbilical hernia containing fat has mildly enlarged in the interval,
measuring up to 6.5 cm transverse. There is new soft tissue
stranding throughout the herniated fat superiorly which could
reflect incarceration or inflammation. There is no herniated bowel.
There is no evidence of bowel obstruction. No significant vascular
findings are demonstrated. There is no lymphadenopathy.

Degenerative changes are noted throughout the lumbar spine. No acute
osseous findings are seen. The pelvis was not imaged.
IMPRESSION: 1. Enlarging periumbilical hernia containing only fat. The herniated
fat demonstrates new soft tissue stranding which may indicate
incarceration or inflammation. There is no herniated bowel.

2. Enlarging nonobstructing bilateral renal calculi. No
hydronephrosis.

These results were called by telephone on 09/06/2012 at 8785 hr to
Dr. Walz, who verbally acknowledged these results.

## 2015-10-12 ENCOUNTER — Encounter: Payer: Self-pay | Admitting: Family Medicine

## 2015-11-02 ENCOUNTER — Encounter: Payer: Self-pay | Admitting: Family Medicine

## 2015-11-02 ENCOUNTER — Ambulatory Visit (INDEPENDENT_AMBULATORY_CARE_PROVIDER_SITE_OTHER): Payer: Medicare Other | Admitting: Family Medicine

## 2015-11-02 VITALS — BP 136/78 | HR 108 | Temp 98.2°F | Resp 18 | Ht 74.5 in | Wt 349.6 lb

## 2015-11-02 DIAGNOSIS — Z23 Encounter for immunization: Secondary | ICD-10-CM

## 2015-11-02 DIAGNOSIS — Z1211 Encounter for screening for malignant neoplasm of colon: Secondary | ICD-10-CM

## 2015-11-02 DIAGNOSIS — E669 Obesity, unspecified: Secondary | ICD-10-CM

## 2015-11-02 DIAGNOSIS — IMO0001 Reserved for inherently not codable concepts without codable children: Secondary | ICD-10-CM

## 2015-11-02 DIAGNOSIS — Z131 Encounter for screening for diabetes mellitus: Secondary | ICD-10-CM

## 2015-11-02 DIAGNOSIS — Z0000 Encounter for general adult medical examination without abnormal findings: Secondary | ICD-10-CM

## 2015-11-02 DIAGNOSIS — Z125 Encounter for screening for malignant neoplasm of prostate: Secondary | ICD-10-CM

## 2015-11-02 DIAGNOSIS — Z13220 Encounter for screening for lipoid disorders: Secondary | ICD-10-CM

## 2015-11-02 DIAGNOSIS — Z1159 Encounter for screening for other viral diseases: Secondary | ICD-10-CM

## 2015-11-02 DIAGNOSIS — Z6841 Body Mass Index (BMI) 40.0 and over, adult: Secondary | ICD-10-CM

## 2015-11-02 LAB — LIPID PANEL
Cholesterol: 186 mg/dL (ref 125–200)
HDL: 42 mg/dL (ref >=40)
LDL Cholesterol: 123 mg/dL (ref <130)
Total CHOL/HDL Ratio: 4.4 ratio (ref <=5.0)
Triglycerides: 107 mg/dL (ref <150)
VLDL: 21 mg/dL (ref <30)

## 2015-11-02 LAB — COMPLETE METABOLIC PANEL WITH GFR
ALBUMIN: 3.6 g/dL (ref 3.6–5.1)
ALK PHOS: 84 U/L (ref 40–115)
ALT: 20 U/L (ref 9–46)
AST: 30 U/L (ref 10–35)
BUN: 18 mg/dL (ref 7–25)
CALCIUM: 8.9 mg/dL (ref 8.6–10.3)
CHLORIDE: 106 mmol/L (ref 98–110)
CO2: 24 mmol/L (ref 20–31)
Creat: 1.2 mg/dL (ref 0.70–1.25)
GFR, EST AFRICAN AMERICAN: 71 mL/min (ref >=60)
GFR, EST NON AFRICAN AMERICAN: 62 mL/min (ref >=60)
Glucose, Bld: 100 mg/dL — ABNORMAL HIGH (ref 65–99)
POTASSIUM: 4.3 mmol/L (ref 3.5–5.3)
Sodium: 140 mmol/L (ref 135–146)
Total Bilirubin: 0.5 mg/dL (ref 0.2–1.2)
Total Protein: 7.3 g/dL (ref 6.1–8.1)

## 2015-11-02 MED ORDER — ZOSTER VACCINE LIVE 19400 UNT/0.65ML ~~LOC~~ SUSR: 0.6500 mL | Freq: Once | SUBCUTANEOUS | 0 refills | Status: AC

## 2015-11-02 NOTE — Progress Notes (Signed)
By signing my name below, I, Mesha Guinyard, attest that this documentation has been prepared under the direction and in the presence of Meredith Staggers, MD.  Electronically Signed: Arvilla Market, Medical Scribe. 11/02/15. 3:30 PM.  Subjective:    Patient ID: William Hutchinson, male    DOB: 09-Sep-1945, 69 y.o.   MRN: [id number]  HPI Chief Complaint  Patient presents with  . Annual Exam    HPI Comments: William Hutchinson is a 70 y.o. male who presents to the [facility] for his annual physical. Last seen in 2014 for urinary frequency and was referred to urology. Pt has a hx of nephrolithiasis and his father died in his late 29s with prostate CA. Also noted to have an elevated bp of 180 at that visit. He was treated for a proteus UTI and treated with cipro. Pt does not check his blood pressure regularly.  Foot Pain: Pt reports intermittent skin pain on the inside his left ankle as well as having internal foot pain that he suspects is gout for years. Pt states his foot pain has been getting worse over the last "couple of months" and describes the sensation as nerves moving to the surface of his skin. Pt will also occassionally get bruises and darkening skin on his feet. Pt has been taking aleve BID, every 12 hours for relief of his symptoms. Pt mentions the edema in his feet after going on a trip and states it's never went down. Pt has not been to a podiatrist. Pt denies pain in his feet first step in the morning. Pt denies falls or injury to his foot. Pt denies cramping feet when he walks long distance.  Kidney: Pt hasn't had any recent kidney stones.  Hernia: Pt reports having a non painful umbilical hernia for a couple of years that he can easily push it back in.  Cancer Screening: Prostate CA: Pt was told his UTI wasn't prostate related. Pt deferred digital rectal exam at this time but would like to get a PSA. No results found for: PSA Colon CA: Pt has not had a  colonoscopy Pt can't see a GI from now to June 2018 due to his insurance coverage being "in bewteen part A and B". Pt denies bloody stool, or melena.  Immunizations: Pt would like to get his flu vaccine and PNA vaccine. Pt isn't sure if he received a tetanus shot in the past 10 years. Pt has never had his shingles vaccine.  There is no immunization history on file for this patient.  Vision: Pt hasn't had an eye exam for years.  Visual Acuity Screening   Right eye Left eye Both eyes  Without correction:     With correction: 20/20 20/25 20/20    Dentist: Pt is not followed by a dentist.  Exercise: Pt is not getting exercise. Pt has only 1 brother with geriatric surgery, and states obesity doesn't run in his family.  Depression Screening: Depression screen [facility] 2/9 11/02/2015  Decreased Interest 0  Down, Depressed, Hopeless 0  PHQ - 2 Score 0   Fall Screening: No falls in the past year. Fall Risk  11/02/2015  Falls in the past year? No   Advance Directives: Pt does not have a living will.  HIV/Hep C Screening: Non reactive HIV reading in 2009. Pt would like to get screened for Hep C.  There are no active problems to display for this patient.  Past Medical History:  Diagnosis Date  .  Allergy   . Arthritis    Past Surgical History:  Procedure Laterality Date  . APPENDECTOMY    . TONSILLECTOMY     No Known Allergies Prior to Admission medications   Medication Sig Start Date End Date Taking? Authorizing Provider  phenazopyridine (PYRIDIUM) 200 MG tablet Take 1 tablet (200 mg total) by mouth 3 (three) times daily as needed for pain. Patient not taking: Reported on 11/02/2015 01/08/13   Tonye Pearson, MD  sulfamethoxazole-trimethoprim (BACTRIM DS) 800-160 MG per tablet Take 1 tablet by mouth 2 (two) times daily. Patient not taking: Reported on 11/02/2015 01/12/13   Tonye Pearson, MD   Social History   Social History  . Marital status: Single    Spouse name: N/A    . Number of children: N/A  . Years of education: N/A   Occupational History  . Not on file.   Social History Main Topics  . Smoking status: Never Smoker  . Smokeless tobacco: Never Used  . Alcohol use Yes  . Drug use: No  . Sexual activity: Yes    Birth control/ protection: Condom   Other Topics Concern  . Not on file   Social History Narrative  . No narrative on file   Review of Systems Objective:  Physical Exam  Constitutional: He is oriented to person, place, and time. He appears well-developed and well-nourished.  HENT:  Head: Normocephalic and atraumatic.  Right Ear: External ear normal.  Left Ear: External ear normal.  Mouth/Throat: Oropharynx is clear and moist.  Eyes: Conjunctivae and EOM are normal. Pupils are equal, round, and reactive to light.  Neck: Normal range of motion. Neck supple. No thyromegaly present.  Cardiovascular: Normal rate, regular rhythm, normal heart sounds and intact distal pulses.   Pulmonary/Chest: Effort normal and breath sounds normal. No respiratory distress. He has no wheezes.  Abdominal: Soft. He exhibits no distension. There is no tenderness. Hernia confirmed negative in the right inguinal area and confirmed negative in the left inguinal area.  Easily reducible umbilical hernia  Musculoskeletal: Normal range of motion. He exhibits no edema or tenderness.  1+ pedal edema bilaterally  Lymphadenopathy:    He has no cervical adenopathy.  Neurological: He is alert and oriented to person, place, and time. He has normal reflexes.  Skin: Skin is warm and dry.  Stasis changes with some hyperpigmentation bilaterally Minimal superficial skin breakdown on the left Thickened skin on his medial ankle Hypopigmentation on his lower abdomen  Psychiatric: He has a normal mood and affect. His behavior is normal.  Vitals reviewed.  BP 136/78   Pulse (!) 108   Temp 98.2 F (36.8 C) (Oral)   Resp 18   Ht 6' 2.5" (1.892 m)   Wt (!) 349 lb 9.6 oz  (158.6 kg)   SpO2 98%   BMI 44.29 kg/m   Body mass index is 44.29 kg/m. Wt Readings from Last 3 Encounters:  11/02/15 (!) 349 lb 9.6 oz (158.6 kg)  01/08/13 (!) 341 lb (154.7 kg)  11/24/12 (!) 337 lb (152.9 kg)    Assessment & Plan:   William Hutchinson is a 70 y.o. male Medicare annual wellness visit, initial  --anticipatory guidance as below in AVS, screening labs above. Health maintenance items as above in HPI discussed/recommended as applicable.   - fall/depression screening reviewed as above.   Special screening for malignant neoplasms, colon - Plan: Ambulatory referral to Gastroenterology  - recommended, but plans on delaying until next year d/t insurance  coverage.   Screening for diabetes mellitus - Plan: COMPLETE METABOLIC PANEL WITH GFR  Screening for hyperlipidemia - Plan: Lipid panel  Special screening, prostate cancer - Plan: PSA  - We discussed pros and cons of prostate cancer screening, and after this discussion, he chose to have screening done with PSA only.  limitations with just PSA discussed.    Flu vaccine need - Plan: Flu Vaccine QUAD 36+ mos IM  Need for prophylactic vaccination against Streptococcus pneumoniae (pneumococcus) - Plan: Pneumococcal conjugate vaccine 13-valent IM  - prevnar given, then pneumovax at future visit.   Need for hepatitis C screening test - Plan: Hepatitis C antibody   Need for shingles vaccine - Plan: Zoster Vaccine Live, PF, (ZOSTAVAX) [id number] UNT/0.65ML injection  -Rx printed, can fill/deliver at pharmacy depending on cost.   Class 3 obesity with body mass index (BMI) of 40.0 to 44.9 in adult, unspecified obesity type, unspecified whether serious comorbidity present (HCC)  - portion control, physical activity discussed as in AVS. Consider nutritionist eval or evan possible bariatric specialist eval if difficulty with wt loss at current BMI.   Plans to RTC to discuss foot pain and LE edema further. stasis changes noted, but  possible early skin breakdown medial ankle - PVD possible. RTC precautions, recheck next visit.   Meds ordered this encounter  Medications  . Zoster Vaccine Live, PF, (ZOSTAVAX) [id number] UNT/0.65ML injection    Sig: Inject 19,400 Units into the skin once.    Dispense:  1 each    Refill:  0   Patient Instructions   Nice meeting you today.   See information on hernia below. If any redness, increased pain or swelling - be seen here or in emergency room.   I placed a referral for colonoscopy, but you can schedule eye care provider and dentist visits.   Check into your last tetanus. If more than 10 years, then you are due (cost may not be covered by medicare)  Check into cost of shingles shot and have that given at your pharmacy if you would like.   For weight - initially work on portion control, minimize fast food and sugar containing beverages, and some form of exercise or activity every day.   Follow up with me in the next few weeks to talk more about your foot pain and swelling in your legs. There is an area on inside your ankle that is starting to appear raw, so I am worried that it may turn into an ulcer.    Keeping you healthy  Get these tests  Blood pressure- Have your blood pressure checked once a year by your healthcare provider.  Normal blood pressure is 120/80  Weight- Have your body mass index (BMI) calculated to screen for obesity.  BMI is a measure of body fat based on height and weight. You can also calculate your own BMI at [email or web address]/.  Cholesterol- Have your cholesterol checked every year.  Diabetes- Have your blood sugar checked regularly if you have high blood pressure, high cholesterol, have a family history of diabetes or if you are overweight.  Screening for Colon Cancer- Colonoscopy starting at age 450.  Screening may begin sooner depending on your family history and other health conditions. Follow up colonoscopy as directed by your  Gastroenterologist.  Screening for Prostate Cancer- Both blood work (PSA) and a rectal exam help screen for Prostate Cancer.  Screening begins at age 68 with African-American men and at age 950 with Caucasian men.  Screening may  begin sooner depending on your family history.  Take these medicines  Aspirin- One aspirin daily can help prevent Heart disease and Stroke.  Flu shot- Every fall.  Tetanus- Every 10 years.  Zostavax- Once after the age of 67 to prevent Shingles.  Pneumonia shot- Once after the age of 5; if you are younger than 25, ask your healthcare provider if you need a Pneumonia shot.  Take these steps  Don't smoke- If you do smoke, talk to your doctor about quitting.  For tips on how to quit, go to www.smokefree.gov or call 1-800-QUIT-NOW.  Be physically active- Exercise 5 days a week for at least 30 minutes.  If you are not already physically active start slow and gradually work up to 30 minutes of moderate physical activity.  Examples of moderate activity include walking briskly, mowing the yard, dancing, swimming, bicycling, etc.  Eat a healthy diet- Eat a variety of healthy food such as fruits, vegetables, low fat milk, low fat cheese, yogurt, lean meant, poultry, fish, beans, tofu, etc. For more information go to www.thenutritionsource.org  Drink alcohol in moderation- Limit alcohol intake to less than two drinks a day. Never drink and drive.  Dentist- Brush and floss twice daily; visit your dentist twice a year.  Depression- Your emotional health is as important as your physical health. If you're feeling down, or losing interest in things you would normally enjoy please talk to your healthcare provider.  Eye exam- Visit your eye doctor every year.  Safe sex- If you may be exposed to a sexually transmitted infection, use a condom.  Seat belts- Seat belts can save your life; always wear one.  Smoke/Carbon Monoxide detectors- These detectors need to be installed on  the appropriate level of your home.  Replace batteries at least once a year.  Skin cancer- When out in the sun, cover up and use sunscreen 15 SPF or higher.  Violence- If anyone is threatening you, please tell your healthcare provider.  Living Will/ Health care power of attorney- Speak with your healthcare provider and family. Hernia, Adult A hernia is the bulging of an organ or tissue through a weak spot in the muscles of the abdomen (abdominal wall). Hernias develop most often near the navel or groin. There are many kinds of hernias. Common kinds include:  Femoral hernia. This kind of hernia develops under the groin in the upper thigh area.  Inguinal hernia. This kind of hernia develops in the groin or scrotum.  Umbilical hernia. This kind of hernia develops near the navel.  Hiatal hernia. This kind of hernia causes part of the stomach to be pushed up into the chest.  Incisional hernia. This kind of hernia bulges through a scar from an abdominal surgery. CAUSES This condition may be caused by:  Heavy lifting.  Coughing over a long period of time.  Straining to have a bowel movement.  An incision made during an abdominal surgery.  A birth defect (congenital defect).  Excess weight or obesity.  Smoking.  Poor nutrition.  Cystic fibrosis.  Excess fluid in the abdomen.  Undescended testicles. SYMPTOMS Symptoms of a hernia include:  A lump on the abdomen. This is the first sign of a hernia. The lump may become more obvious with standing, straining, or coughing. It may get bigger over time if it is not treated or if the condition causing it is not treated.  Pain. A hernia is usually painless, but it may become painful over time if treatment is delayed.  The pain is usually dull and may get worse with standing or lifting heavy objects. Sometimes a hernia gets tightly squeezed in the weak spot (strangulated) or stuck there (incarcerated) and causes additional symptoms.  These symptoms may include:  Vomiting.  Nausea.  Constipation.  Irritability. DIAGNOSIS A hernia may be diagnosed with:  A physical exam. During the exam your health care provider may ask you to cough or to make a specific movement, because a hernia is usually more visible when you move.  Imaging tests. These can include:  X-rays.  Ultrasound.  CT scan. TREATMENT A hernia that is small and painless may not need to be treated. A hernia that is large or painful may be treated with surgery. Inguinal hernias may be treated with surgery to prevent incarceration or strangulation. Strangulated hernias are always treated with surgery, because lack of blood to the trapped organ or tissue can cause it to die. Surgery to treat a hernia involves pushing the bulge back into place and repairing the weak part of the abdomen. HOME CARE INSTRUCTIONS  Avoid straining.  Do not lift anything heavier than 10 lb (4.5 kg).  Lift with your leg muscles, not your back muscles. This helps avoid strain.  When coughing, try to cough gently.  Prevent constipation. Constipation leads to straining with bowel movements, which can make a hernia worse or cause a hernia repair to break down. You can prevent constipation by:  Eating a high-fiber diet that includes plenty of fruits and vegetables.  Drinking enough fluids to keep your urine clear or pale yellow. Aim to drink 6-8 glasses of water per day.  Using a stool softener as directed by your health care provider.  Lose weight, if you are overweight.  Do not use any tobacco products, including cigarettes, chewing tobacco, or electronic cigarettes. If you need help quitting, ask your health care provider.  Keep all follow-up visits as directed by your health care provider. This is important. Your health care provider may need to monitor your condition. SEEK MEDICAL CARE IF:  You have swelling, redness, and pain in the affected area.  Your bowel  habits change. SEEK IMMEDIATE MEDICAL CARE IF:  You have a fever.  You have abdominal pain that is getting worse.  You feel nauseous or you vomit.  You cannot push the hernia back in place by gently pressing on it while you are lying down.  The hernia:  Changes in shape or size.  Is stuck outside the abdomen.  Becomes discolored.  Feels hard or tender.   This information is not intended to replace advice given to you by your health care provider. Make sure you discuss any questions you have with your health care provider.   Document Released: 12/31/2004 Document Revised: 01/21/2014 Document Reviewed: 11/10/2013 Elsevier Interactive Patient Education 2016 [employer].     IF you received an x-ray today, you will receive an invoice from [facility]. Please contact [facility] at [phone] with questions or concerns regarding your invoice.   IF you received labwork today, you will receive an invoice from [employer]. Please contact [facility] at [phone] with questions or concerns regarding your invoice.   Our billing staff will not be able to assist you with questions regarding bills from these companies.  You will be contacted with the lab results as soon as they are available. The fastest way to get your results is to activate your My Chart account. Instructions are located on the last page  of this paperwork. If you have not heard from [place] regarding the results in 2 weeks, please contact this office.        I personally performed the services described in this documentation, which was scribed in my presence. The recorded information has been reviewed and considered, and addended by me as needed.   Signed,   Meredith Staggers, MD [facility].  11/03/15 10:46 PM

## 2015-11-02 NOTE — Patient Instructions (Addendum)
Nice meeting you today.   See information on hernia below. If any redness, increased pain or swelling - be seen here or in emergency room.   I placed a referral for colonoscopy, but you can schedule eye care provider and dentist visits.   Check into your last tetanus. If more than 10 years, then you are due (cost may not be covered by medicare)  Check into cost of shingles shot and have that given at your pharmacy if you would like.   For weight - initially work on portion control, minimize fast food and sugar containing beverages, and some form of exercise or activity every day.   Follow up with me in the next few weeks to talk more about your foot pain and swelling in your legs. There is an area on inside your ankle that is starting to appear raw, so I am worried that it may turn into an ulcer.    Keeping you healthy  Get these tests  Blood pressure- Have your blood pressure checked once a year by your healthcare provider.  Normal blood pressure is 120/80  Weight- Have your body mass index (BMI) calculated to screen for obesity.  BMI is a measure of body fat based on height and weight. You can also calculate your own BMI at [email or web address].  Cholesterol- Have your cholesterol checked every year.  Diabetes- Have your blood sugar checked regularly if you have high blood pressure, high cholesterol, have a family history of diabetes or if you are overweight.  Screening for Colon Cancer- Colonoscopy starting at age 71.  Screening may begin sooner depending on your family history and other health conditions. Follow up colonoscopy as directed by your Gastroenterologist.  Screening for Prostate Cancer- Both blood work (PSA) and a rectal exam help screen for Prostate Cancer.  Screening begins at age 33 with African-American men and at age 36 with Caucasian men.  Screening may begin sooner depending on your family history.  Take these medicines  Aspirin- One aspirin daily can help  prevent Heart disease and Stroke.  Flu shot- Every fall.  Tetanus- Every 10 years.  Zostavax- Once after the age of 68 to prevent Shingles.  Pneumonia shot- Once after the age of 59; if you are younger than 3, ask your healthcare provider if you need a Pneumonia shot.  Take these steps  Don't smoke- If you do smoke, talk to your doctor about quitting.  For tips on how to quit, go to www.smokefree.gov or call 1-800-QUIT-NOW.  Be physically active- Exercise 5 days a week for at least 30 minutes.  If you are not already physically active start slow and gradually work up to 30 minutes of moderate physical activity.  Examples of moderate activity include walking briskly, mowing the yard, dancing, swimming, bicycling, etc.  Eat a healthy diet- Eat a variety of healthy food such as fruits, vegetables, low fat milk, low fat cheese, yogurt, lean meant, poultry, fish, beans, tofu, etc. For more information go to www.thenutritionsource.org  Drink alcohol in moderation- Limit alcohol intake to less than two drinks a day. Never drink and drive.  Dentist- Brush and floss twice daily; visit your dentist twice a year.  Depression- Your emotional health is as important as your physical health. If you're feeling down, or losing interest in things you would normally enjoy please talk to your healthcare provider.  Eye exam- Visit your eye doctor every year.  Safe sex- If you may be exposed to a sexually transmitted infection, use  a condom.  Seat belts- Seat belts can save your life; always wear one.  Smoke/Carbon Monoxide detectors- These detectors need to be installed on the appropriate level of your home.  Replace batteries at least once a year.  Skin cancer- When out in the sun, cover up and use sunscreen 15 SPF or higher.  Violence- If anyone is threatening you, please tell your healthcare provider.  Living Will/ Health care power of attorney- Speak with your healthcare provider and  family. Hernia, Adult A hernia is the bulging of an organ or tissue through a weak spot in the muscles of the abdomen (abdominal wall). Hernias develop most often near the navel or groin. There are many kinds of hernias. Common kinds include:  Femoral hernia. This kind of hernia develops under the groin in the upper thigh area.  Inguinal hernia. This kind of hernia develops in the groin or scrotum.  Umbilical hernia. This kind of hernia develops near the navel.  Hiatal hernia. This kind of hernia causes part of the stomach to be pushed up into the chest.  Incisional hernia. This kind of hernia bulges through a scar from an abdominal surgery. CAUSES This condition may be caused by:  Heavy lifting.  Coughing over a long period of time.  Straining to have a bowel movement.  An incision made during an abdominal surgery.  A birth defect (congenital defect).  Excess weight or obesity.  Smoking.  Poor nutrition.  Cystic fibrosis.  Excess fluid in the abdomen.  Undescended testicles. SYMPTOMS Symptoms of a hernia include:  A lump on the abdomen. This is the first sign of a hernia. The lump may become more obvious with standing, straining, or coughing. It may get bigger over time if it is not treated or if the condition causing it is not treated.  Pain. A hernia is usually painless, but it may become painful over time if treatment is delayed. The pain is usually dull and may get worse with standing or lifting heavy objects. Sometimes a hernia gets tightly squeezed in the weak spot (strangulated) or stuck there (incarcerated) and causes additional symptoms. These symptoms may include:  Vomiting.  Nausea.  Constipation.  Irritability. DIAGNOSIS A hernia may be diagnosed with:  A physical exam. During the exam your health care provider may ask you to cough or to make a specific movement, because a hernia is usually more visible when you move.  Imaging tests. These can  include:  X-rays.  Ultrasound.  CT scan. TREATMENT A hernia that is small and painless may not need to be treated. A hernia that is large or painful may be treated with surgery. Inguinal hernias may be treated with surgery to prevent incarceration or strangulation. Strangulated hernias are always treated with surgery, because lack of blood to the trapped organ or tissue can cause it to die. Surgery to treat a hernia involves pushing the bulge back into place and repairing the weak part of the abdomen. HOME CARE INSTRUCTIONS  Avoid straining.  Do not lift anything heavier than 10 lb (4.5 kg).  Lift with your leg muscles, not your back muscles. This helps avoid strain.  When coughing, try to cough gently.  Prevent constipation. Constipation leads to straining with bowel movements, which can make a hernia worse or cause a hernia repair to break down. You can prevent constipation by:  Eating a high-fiber diet that includes plenty of fruits and vegetables.  Drinking enough fluids to keep your urine clear or pale yellow.  Aim to drink 6-8 glasses of water per day.  Using a stool softener as directed by your health care provider.  Lose weight, if you are overweight.  Do not use any tobacco products, including cigarettes, chewing tobacco, or electronic cigarettes. If you need help quitting, ask your health care provider.  Keep all follow-up visits as directed by your health care provider. This is important. Your health care provider may need to monitor your condition. SEEK MEDICAL CARE IF:  You have swelling, redness, and pain in the affected area.  Your bowel habits change. SEEK IMMEDIATE MEDICAL CARE IF:  You have a fever.  You have abdominal pain that is getting worse.  You feel nauseous or you vomit.  You cannot push the hernia back in place by gently pressing on it while you are lying down.  The hernia:  Changes in shape or size.  Is stuck outside the  abdomen.  Becomes discolored.  Feels hard or tender.   This information is not intended to replace advice given to you by your health care provider. Make sure you discuss any questions you have with your health care provider.   Document Released: 12/31/2004 Document Revised: 01/21/2014 Document Reviewed: 11/10/2013 Elsevier Interactive Patient Education 2016 [employer]     IF you received an x-ray today, you will receive an invoice [facility]. Please contact [facility] at [phone] with questions or concerns regarding your invoice.   IF you received labwork today, you will receive an invoice from [facility]. Please contact [facility] at [phone] with questions or concerns regarding your invoice.   Our billing staff will not be able to assist you with questions regarding bills from these companies.  You will be contacted with the lab results as soon as they are available. The fastest way to get your results is to activate your My Chart account. Instructions are located on the last page of this paperwork. If you have not heard from us regarding the results in 2 weeks, please contact this office.

## 2015-11-03 LAB — PSA: PSA: 0.5 ng/mL (ref <=4.0)

## 2015-11-03 LAB — HEPATITIS C ANTIBODY: HCV AB: NEGATIVE

## 2015-11-16 ENCOUNTER — Encounter: Payer: Self-pay | Admitting: Family Medicine

## 2015-11-16 ENCOUNTER — Ambulatory Visit (INDEPENDENT_AMBULATORY_CARE_PROVIDER_SITE_OTHER): Payer: Medicare Other | Admitting: Family Medicine

## 2015-11-16 VITALS — BP 144/96 | HR 110 | Temp 98.3°F | Resp 16 | Ht 73.0 in | Wt 345.8 lb

## 2015-11-16 DIAGNOSIS — R600 Localized edema: Secondary | ICD-10-CM

## 2015-11-16 DIAGNOSIS — M25572 Pain in left ankle and joints of left foot: Secondary | ICD-10-CM

## 2015-11-16 DIAGNOSIS — R000 Tachycardia, unspecified: Secondary | ICD-10-CM

## 2015-11-16 LAB — TSH: TSH: 2.23 m[IU]/L (ref 0.40–4.50)

## 2015-11-16 NOTE — Patient Instructions (Addendum)
  Wear compression stockings to help lessen swelling in legs. Let me know if you change your mind about meeting with a vascular specialist, as I am concerned about the appearance of your lower legs as well as the few areas that seem to have some skin breakdown. This may be an indication of some underlying peripheral vascular disease that may need further evaluation and treatment. If you have any redness, or open wounds in the legs, return right away to be seen.  Heart rate was slightly elevated today and last visit.  I will check a thyroid test, but if you do have any heart palpitations, or new symptoms, return here or other medical provider.   I will check a gout test, but if the foot pain returns, return to look into other causes.  Return to the clinic or go to the nearest emergency room if any of your symptoms worsen or new symptoms occur.   Peripheral Edema You have swelling in your legs (peripheral edema). This swelling is due to excess accumulation of salt and water in your body. Edema may be a sign of heart, kidney or liver disease, or a side effect of a medication. It may also be due to problems in the leg veins. Elevating your legs and using special support stockings may be very helpful, if the cause of the swelling is due to poor venous circulation. Avoid long periods of standing, whatever the cause. Treatment of edema depends on identifying the cause. Chips, pretzels, pickles and other salty foods should be avoided. Restricting salt in your diet is almost always needed. Water pills (diuretics) are often used to remove the excess salt and water from your body via urine. These medicines prevent the kidney from reabsorbing sodium. This increases urine flow. Diuretic treatment may also result in lowering of potassium levels in your body. Potassium supplements may be needed if you have to use diuretics daily. Daily weights can help you keep track of your progress in clearing your edema. You should  call your caregiver for follow up care as recommended. SEEK IMMEDIATE MEDICAL CARE IF:   You have increased swelling, pain, redness, or heat in your legs.  You develop shortness of breath, especially when lying down.  You develop chest or abdominal pain, weakness, or fainting.  You have a fever.   This information is not intended to replace advice given to you by your health care provider. Make sure you discuss any questions you have with your health care provider.   Document Released: 02/08/2004 Document Revised: 03/25/2011 Document Reviewed: 07/13/2014 Elsevier Interactive Patient Education 2016 [employer]   IF you received an x-ray today, you will receive an invoice from [facility]. Please contact [facility] at [phone] with questions or concerns regarding your invoice.   IF you received labwork today, you will receive an invoice from [facility]. Please contact [facility] at [phone] with questions or concerns regarding your invoice.   Our billing staff will not be able to assist you with questions regarding bills from these companies.  You will be contacted with the lab results as soon as they are available. The fastest way to get your results is to activate your My Chart account. Instructions are located on the last page of this paperwork. If you have not heard from us regarding the results in 2 weeks, please contact this office.

## 2015-11-16 NOTE — Progress Notes (Signed)
By signing my name below, I, Mesha Guinyard, attest that this documentation has been prepared under the direction and in the presence of Meredith StaggersJeffrey Gizelle Whetsel, MD.  Electronically Signed: Arvilla MarketMesha Guinyard, Medical Scribe. 11/16/15. 4:00 PM.  Subjective:    Patient ID: William Hutchinson, male    DOB: 03-13-1945, 70 y.o.   MRN: [id number]  HPI Chief Complaint  Patient presents with  . discuss labs    HPI Comments: William Hutchinson is a 70 y.o. male who presents to the [facility] for follow-up. Seen Oct 19 for medicare wellness exam, as well as other concerns, including weight/obesity. At that visit he complained of foot pain and lower extremity edema. There was some stasis changes noted as well as skin break down of his medial left ankle. Asked to follow-up to discuss further.  Reports his feet aren't bothering him as bad as they were 10/19 and last flare was yesterday after walking, when it normally ocurs.  Better now. Pain is decribed as sore, normally located on the dorsal side of both feet L>R, and it occurs "out of the blue" without triggers besides walking, could go months without foot pain. No known hx of gout. Had leg CT in 2009 for cellulitis in his legs. Pt was told he "had a tendency of getting" varicose veins and was scraped by a vascular surgeon. After wearing support stocking his lower extremity got better, but not wearing those recently.  Denies red color changes when episode occurs, denies pain at his great toe, pain running down legs, pain in toes, chest pain, palpitations, and lumps/bumps around his thyroid. No known hx of thyroid disease.   There are no active problems to display for this patient.  Past Medical History:  Diagnosis Date  . Allergy   . Arthritis    Past Surgical History:  Procedure Laterality Date  . APPENDECTOMY    . TONSILLECTOMY     No Known Allergies Prior to Admission medications   Not on File   Social History   Social History    . Marital status: Single    Spouse name: N/A  . Number of children: N/A  . Years of education: N/A   Occupational History  . Not on file.   Social History Main Topics  . Smoking status: Never Smoker  . Smokeless tobacco: Never Used  . Alcohol use Yes  . Drug use: No  . Sexual activity: Yes    Birth control/ protection: Condom   Other Topics Concern  . Not on file   Social History Narrative  . No narrative on file   Review of Systems  Cardiovascular: Negative for chest pain and palpitations.  Musculoskeletal: Positive for arthralgias (foot pain).   Objective:  Physical Exam  Constitutional: He appears well-developed and well-nourished. No distress.  HENT:  Head: Normocephalic and atraumatic.  Eyes: Conjunctivae are normal.  Neck: Neck supple. Thyromegaly present. No thyroid mass present.  Prominent thyroid but no prominent nodules.  Cardiovascular: Normal rate, regular rhythm and normal heart sounds.  Exam reveals no gallop and no friction rub.   No murmur heard. Pulmonary/Chest: Effort normal and breath sounds normal. No respiratory distress. He has no wheezes. He has no rales.  Musculoskeletal:  Firm skin lower extremity bilaterally with pitting edema Foot non tender  Neurological: He is alert.  Skin: Skin is warm and dry. No erythema.  Thickened scaly skin medical and lateral ankle with some hypopigmentation No erythema  Psychiatric: He has a  normal mood and affect. His behavior is normal.  Nursing note and vitals reviewed.  BP (!) 144/96   Pulse (!) 110   Temp 98.3 F (36.8 C) (Oral)   Resp 16   Ht 6\' 1"  (1.854 m)   Wt (!) 345 lb 12.8 oz (156.9 kg)   SpO2 98%   BMI 45.62 kg/m  Assessment & Plan:  William Hutchinson is a 70 y.o. male Pain of joint of left ankle and foot - Plan: Uric Acid Pedal edema - Plan: TSH  - Long-standing pedal edema/peripheral edema. Suspect some component of vascular insufficiency, but also discussed concerns of peripheral  vascular disease with not only stasis changes, but some areas of possible slight skin breakdown. Previous areas of breakdown improved today without signs of surrounding infection, but did recommend evaluation with vascular surgeon.   -pt declined referral at this time to surgeon, but will let me know if he changes his mind. In the meantime recommended restarting compression stockings and elevating feet when seated. RTC precautions discussed.   Tachycardia - Plan: TSH  - Noted past 2 visits. He attributes this to depression around/increased stress with trying to get out of town today and having a leak in basement today.  Denies palpitations, chest pain or other symptoms. Possible prominence of thyroid on exam.   -check TSH, consider ultrasound if abnormal. rtc precautions. Recheck in 3 months.   No orders of the defined types were placed in this encounter.  Patient Instructions    Wear compression stockings to help lessen swelling in legs. Let me know if you change your mind about meeting with a vascular specialist, as I am concerned about the appearance of your lower legs as well as the few areas that seem to have some skin breakdown. This may be an indication of some underlying peripheral vascular disease that may need further evaluation and treatment. If you have any redness, or open wounds in the legs, return right away to be seen.  Heart rate was slightly elevated today and last visit.  I will check a thyroid test, but if you do have any heart palpitations, or new symptoms, return here or other medical provider.   I will check a gout test, but if the foot pain returns, return to look into other causes.  Return to the clinic or go to the nearest emergency room if any of your symptoms worsen or new symptoms occur.   Peripheral Edema You have swelling in your legs (peripheral edema). This swelling is due to excess accumulation of salt and water in your body. Edema may be a sign of heart,  kidney or liver disease, or a side effect of a medication. It may also be due to problems in the leg veins. Elevating your legs and using special support stockings may be very helpful, if the cause of the swelling is due to poor venous circulation. Avoid long periods of standing, whatever the cause. Treatment of edema depends on identifying the cause. Chips, pretzels, pickles and other salty foods should be avoided. Restricting salt in your diet is almost always needed. Water pills (diuretics) are often used to remove the excess salt and water from your body via urine. These medicines prevent the kidney from reabsorbing sodium. This increases urine flow. Diuretic treatment may also result in lowering of potassium levels in your body. Potassium supplements may be needed if you have to use diuretics daily. Daily weights can help you keep track of your progress in clearing your  edema. You should call your caregiver for follow up care as recommended. SEEK IMMEDIATE MEDICAL CARE IF:   You have increased swelling, pain, redness, or heat in your legs.  You develop shortness of breath, especially when lying down.  You develop chest or abdominal pain, weakness, or fainting.  You have a fever.   This information is not intended to replace advice given to you by your health care provider. Make sure you discuss any questions you have with your health care provider.   Document Released: 02/08/2004 Document Revised: 03/25/2011 Document Reviewed: 07/13/2014 Elsevier Interactive Patient Education 2016 [employer].   IF you received an x-ray today, you will receive an invoice from [facility]. Please contact [facility] at [phone] with questions or concerns regarding your invoice.   IF you received labwork today, you will receive an invoice from [facility]. Please contact [facility] at [phone] with questions or concerns regarding your invoice.   Our  billing staff will not be able to assist you with questions regarding bills from these companies.  You will be contacted with the lab results as soon as they are available. The fastest way to get your results is to activate your My Chart account. Instructions are located on the last page of this paperwork. If you have not heard from [place] regarding the results in 2 weeks, please contact this office.

## 2015-11-17 LAB — URIC ACID: URIC ACID, SERUM: 7.2 mg/dL (ref 4.0–8.0)
